# Patient Record
Sex: Male | Born: 1945 | Race: White | Hispanic: No | Marital: Married | Smoking: Current every day smoker
Health system: Southern US, Community
[De-identification: ages and names within clinical notes are randomized; demographics above are authoritative.]

## PROBLEM LIST (undated history)

## (undated) DIAGNOSIS — J45909 Unspecified asthma, uncomplicated: Secondary | ICD-10-CM

## (undated) DIAGNOSIS — Z720 Tobacco use: Secondary | ICD-10-CM

## (undated) DIAGNOSIS — E785 Hyperlipidemia, unspecified: Secondary | ICD-10-CM

## (undated) DIAGNOSIS — I2510 Atherosclerotic heart disease of native coronary artery without angina pectoris: Secondary | ICD-10-CM

## (undated) HISTORY — DX: Atherosclerotic heart disease of native coronary artery without angina pectoris: I25.10

## (undated) HISTORY — PX: OTHER SURGICAL HISTORY: SHX169

## (undated) HISTORY — DX: Tobacco use: Z72.0

## (undated) HISTORY — DX: Hyperlipidemia, unspecified: E78.5

---

## 1998-01-30 ENCOUNTER — Ambulatory Visit (HOSPITAL_COMMUNITY): Admission: RE | Admit: 1998-01-30 | Discharge: 1998-01-30 | Payer: Self-pay | Admitting: Surgery

## 2001-11-22 ENCOUNTER — Encounter: Payer: Self-pay | Admitting: Emergency Medicine

## 2001-11-22 ENCOUNTER — Emergency Department (HOSPITAL_COMMUNITY): Admission: EM | Admit: 2001-11-22 | Discharge: 2001-11-22 | Payer: Self-pay | Admitting: Emergency Medicine

## 2004-06-02 ENCOUNTER — Inpatient Hospital Stay (HOSPITAL_BASED_OUTPATIENT_CLINIC_OR_DEPARTMENT_OTHER): Admission: RE | Admit: 2004-06-02 | Discharge: 2004-06-02 | Payer: Self-pay | Admitting: Cardiology

## 2004-06-05 ENCOUNTER — Ambulatory Visit (HOSPITAL_COMMUNITY): Admission: RE | Admit: 2004-06-05 | Discharge: 2004-06-06 | Payer: Self-pay | Admitting: Cardiology

## 2007-07-07 ENCOUNTER — Ambulatory Visit (HOSPITAL_COMMUNITY): Admission: RE | Admit: 2007-07-07 | Discharge: 2007-07-07 | Payer: Self-pay | Admitting: Cardiology

## 2007-08-07 ENCOUNTER — Encounter: Payer: Self-pay | Admitting: Cardiology

## 2010-06-03 ENCOUNTER — Encounter: Admission: RE | Admit: 2010-06-03 | Discharge: 2010-06-03 | Payer: Self-pay | Admitting: Cardiology

## 2010-06-03 ENCOUNTER — Ambulatory Visit: Payer: Self-pay | Admitting: Cardiology

## 2010-09-02 ENCOUNTER — Encounter
Admission: RE | Admit: 2010-09-02 | Discharge: 2010-09-02 | Payer: Self-pay | Source: Home / Self Care | Admitting: Cardiology

## 2011-01-22 ENCOUNTER — Telehealth: Payer: Self-pay | Admitting: Cardiology

## 2011-01-22 NOTE — Telephone Encounter (Signed)
WANTS TO KNOW IF SAMPLES PLAVIX 75MG  AND NIASAN 500MG 

## 2011-01-22 NOTE — Telephone Encounter (Signed)
Samples of Plavix 75 mg and Niaspan 500 mg left up front for wife to pick up.  Wife notified.

## 2011-02-12 NOTE — H&P (Signed)
NAME:  Carl Barrera, Carl Barrera                            ACCOUNT NO.:  [id number]   MEDICAL RECORD NO.:  [id number]                   PATIENT TYPE:  AMB   LOCATION:                                       FACILITY:  [facility]   PHYSICIAN:  Colleen Can. Deborah Chalk, M.D.            DATE OF BIRTH:  Jan 08, 1946   DATE OF ADMISSION:  06/02/2004  DATE OF DISCHARGE:                                HISTORY & PHYSICAL   CHIEF COMPLAINT:  None.   HISTORY OF PRESENT ILLNESS:  Carl Barrera is a 65 year old white male who has  known coronary disease.  He presented for his routine annual appointment on  May 26, 2004.  At that time, he underwent regular treadmill testing.  He  was able to exercise on the standard Bruce protocol for a total of 9 minutes  to a maximum of 3.4 miles per hour at 14% grade.  The test was stopped due  to completion.  There were no clinical complaints of chest pain.  However,  his EKG did demonstrate evidence of ischemia in the inferolateral leads.  He  now presents for repeat catheterization.  Clinically, he has done well with  no complaints of chest pain.   PAST MEDICAL HISTORY:  1.  Atherosclerotic cardiovascular disease.  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  His last catheterization was in April of 1995 which showed      normal LV function.  The left circumflex was satisfactory.  There was      diffuse disease otherwise in the right coronary as well as the LAD that      was not suited for angioplasty due to the diffuseness nature.  At that      time, he was managed medically.  2.  Ongoing tobacco abuse.  3.  Aspirin allergy.  4.  Hyperlipidemia.   ALLERGIES:  ASPIRIN.   CURRENT MEDICINES:  1.  Niaspan 500 mg at bedtime.  2.  Nitroglycerin p.r.n.  3.  Plavix 75 mg a day.  4.  Lipitor 40 mg daily.  5.  Zantac 75 mg daily.  6.  Imdur 30 mg daily.   FAMILY HISTORY:  Family history is negative for heart  disease.   SOCIAL HISTORY:  He is married.  He lives in [place].  He does have ongoing  tobacco abuse and no alcohol use.   REVIEW OF SYSTEMS:  Review of systems is as noted above and is otherwise  unremarkable.   PHYSICAL EXAMINATION:  GENERAL:  On exam, he is currently in no acute  distress.  He is currently pain-free.  VITAL SIGNS:  Blood pressure initially 170/100.  Heart rate is in the low  50s.  Respiratory rate is 18.  He is afebrile.  SKIN:  Skin is warm  and dry.  Color is unremarkable.  LUNGS:  Lungs are somewhat coarse.  CARDIAC:  Exam shows a regular rhythm.  ABDOMEN:  Abdomen is soft.  EXTREMITIES:  Extremities are without edema.  NEUROLOGIC:  Neurologic is intact.   PERTINENT LABORATORIES:  Pertinent labs are pending.   OVERALL IMPRESSION:  1.  Abnormal EKG treadmill testing.  2.  Known coronary disease with remote history of inferoposterior myocardial      infarction that dates back to January of 1994 with angioplasty of the      left circumflex.  3.  Ongoing tobacco abuse.  4.  Hyperlipidemia.  5.  Elevated blood pressure.   PLAN:  We will proceed on with repeat catheterization.  The procedure has  been reviewed in full detail including the risks and benefits, and he is  willing to proceed on Tuesday, June 02, 2004.      Sharlee Blew, N.P.                     Colleen Can. Deborah Chalk, M.D.    LC/MEDQ  D:  05/26/2004  T:  05/26/2004  Job:  [id number]   cc:   Colon Flattery, D.O.  [address]  [place]  [place] [address]  Fax: [phone]

## 2011-02-12 NOTE — Cardiovascular Report (Signed)
NAME:  Carl Barrera, Carl Barrera                            ACCOUNT NO.:  [id number]   MEDICAL RECORD NO.:  [id number]                   PATIENT TYPE:  OIB   LOCATION:  6501                                 FACILITY:  [facility]   PHYSICIAN:  Colleen Can. Deborah Chalk, M.D.            DATE OF BIRTH:  02-28-1946   DATE OF PROCEDURE:  06/02/2004  DATE OF DISCHARGE:                              CARDIAC CATHETERIZATION   HISTORY:  Mr. Sebo has a history of inferior posterior myocardial  infarction in 1994 with angioplasty of his left circumflex coronary.  He has  done well since that time with annual exercise stress tests.  We have only  been moderately successful in modifying cardiovascular risk factors and he  continues to smoke.  He presents with worsening EKG changes in a serial  fashion on exercise stress study.   PROCEDURE:  Left heart catheterization with selective coronary angiography,  left ventricular angiography.   TYPE AND SITE OF ENTRY:  Percutaneous, right femoral artery.   CATHETERS:  4-French 4 curved Judkins right and left coronary catheters; 4-  French pigtail ventriculographic catheter.   CONTRAST MATERIAL:  Omnipaque.   MEDICATIONS GIVEN PRIOR TO THE PROCEDURE:  Valium 10 mg p.o.   MEDICATIONS GIVEN DURING THE PROCEDURE:  Versed 2 mg IV.   COMMENTS:  The patient tolerated the procedure well.   HEMODYNAMIC DATA:  1.  The aortic pressure was 122/69.  2.  LV pressure was 124/6-8.   ANGIOGRAPHIC DATA:  1.  Right coronary artery had a diffuse disease proximally.  In the      beginning of the posterior descending there was a 90% narrowing over      approximately 10 mm.  There was satisfactory luminal size prior and post      to this severe stenosis.  There was diffuse disease in the right      coronary artery otherwise.  2.  Left main coronary artery is normal.  3.  Left circumflex:  Continues as a large obtuse marginal branch.  There      are diffuse irregularities, but no significant  focal narrowing.  This      large obtuse marginal continues to the apex.  4.  Left anterior descending:  Moderately large vessel that crosses the      apex.  The left anterior descending is relatively free of disease      proximally including a large first diagonal vessel.  It continues      relatively free of severe disease until the origin of the second      diagonal vessel.  The origin of the second diagonal has severe 80%+      narrowing.  We then enter a segment of the left anterior descending that      would be approximately 30 mm in length.  This area is severely and      diffusely diseased.  Would  be approximately 80% narrowed.  There is      diffuse irregularities even further distally but the vessel is      relatively small and obviously would be diffusely diseased.  There is      potential for long segmental stent being placed in the left anterior      descending that has a potential of occluding the second diagonal versus      continued medical management of this area.   LEFT VENTRICULAR ANGIOGRAM:  Left ventricular angiogram was performed in the  RAO position.  Overall cardiac size and silhouette are normal.  The global  ejection fraction is estimated to be at 50-60%.  There is very minimal  distal inferior lateral hypokinesia.  Inferior wall and anterior wall track  well.  There is no mitral regurgitation, intracardiac calcification, or  intracavitary filling defect.   OVERALL IMPRESSION:  1.  Essentially normal left ventricular function with very minimal distal      inferior lateral hypokinesia.  2.  Diffuse three vessel coronary disease with severe stenosis in posterior      descending coronary that would be amenable to percutaneous intervention,      diffuse irregularities without severe focal narrowing in the left      circumflex, severe segmental disease in left anterior descending that is      relatively distal, but does not appear to be ideally approachable with       percutaneous intervention.   DISCUSSION:  It is felt that probably the best option will be to try to open  the severe stenosis in the posterior descending.  At that point in time Mr.  Pringle would have relatively distal single vessel disease with well preserved  left ventricular function and should have a good prognosis.  If he continues  to have an abnormal stress test, we could consider a higher risk  intervention on his diffuse segmental area in the left anterior descending,  but would try to reserve that should symptoms dictate.                                               Colleen Can. Deborah Chalk, M.D.    SNT/MEDQ  D:  06/02/2004  T:  06/02/2004  Job:  [id number]

## 2011-02-12 NOTE — Cardiovascular Report (Signed)
NAME:  ADEEL, GUIFFRE                            ACCOUNT NO.:  [id number]   MEDICAL RECORD NO.:  [id number]                   PATIENT TYPE:  OIB   LOCATION:  6527                                 FACILITY:  [facility]   PHYSICIAN:  Colleen Can. Deborah Chalk, M.D.            DATE OF BIRTH:  May 23, 1946   DATE OF PROCEDURE:  06/04/2004  DATE OF DISCHARGE:                              CARDIAC CATHETERIZATION   PROCEDURE:  Stent placement to the posterior descending coronary artery.   The initial angiograms demonstrated a 90%, 10 mm stenosis in the posterior  descending artery as well as moderate, diffuse irregularities in the  proximal right coronary artery.  Using a JR-4 guide, a Hydro Floppy guide  wire was easily passed to cross the stenosis.  We predilated it with a 2.5 x  10 mm Maverick balloon and then returned with a 2.5 x 13 mm Cypher stent to  a total of 14 atmospheres.  Initially, there appeared to be spasm distal to  the stent and intracoronary.  Nitroglycerin was given with complete  resolution.  The final angiographic result was felt to be excellent.   We then turned our attention to the left coronary artery and took one  picture of that using the 6-French system.  This confirmed the presence of  diffuse 70% narrowings in the left anterior descending and really no clear-  cut point to begin or end this stent.  We will continued to try to treat him  medically in this regard.   OVERALL IMPRESSION:  1.  Successful stent placement in posterior descending coronary.  2.  Segmental 70% narrowing in the left anterior descending, involving the      second diagonal vessel with a decision to proceed on medically in      management of this area.                                               Colleen Can. Deborah Chalk, M.D.    SNT/MEDQ  D:  06/05/2004  T:  06/06/2004  Job:  [id number]   cc:   Colon Flattery, D.O.  [address]  [place]  [place] [address]  Fax: [phone]

## 2011-02-12 NOTE — Discharge Summary (Signed)
NAME:  Carl Barrera, Carl Barrera                            ACCOUNT NO.:  [id number]   MEDICAL RECORD NO.:  [id number]                   PATIENT TYPE:  OIB   LOCATION:  6527                                 FACILITY:  [facility]   PHYSICIAN:  Colleen Can. Deborah Chalk, M.D.            DATE OF BIRTH:  18-Oct-1945   DATE OF ADMISSION:  06/05/2004  DATE OF DISCHARGE:  06/06/2004                                 DISCHARGE SUMMARY   PRIMARY DISCHARGE DIAGNOSES:  1.  Atherosclerotic cardiovascular disease with recent elective cardiac      catheterization on June 02, 2004 showing essentially normal left      ventricular function, very minimal distal inferolateral hypokinesia,      diffuse 3 vessel coronary disease with severe stenosis in the posterior      descending, diffuse irregularities without severe focal narrowing in the      left circumflex, severe segmental disease in the left anterior      descending that is relatively distal, but does not appear to be ideally      approachable with percutaneous intervention.  He is now status post      stent placement with a 2.5x13 mm CYPHER stent to the posterior      descending artery on June 04, 2004.  2.  Known atherosclerotic cardiovascular disease, previous history of      inferoposterior myocardial infarction that dates back to January of 1994      with previous angioplasty of the left circumflex.  3.  Ongoing tobacco abuse.  4.  Aspirin allergy.  5.  Hyperlipidemia.   HISTORY OF PRESENT ILLNESS:  The patient is a 65 year old white male who has  known coronary disease.  He presented for his routine treadmill test here on  May 26, 2004.  At that time, he was able to exercise on the standard  Bruce protocol for a total of 9 minutes to a maximum of 3.4 miles per hour  at 14% grade.  The test was stopped due to completion and there were  clinically no complaints of chest pain; however, his EKG did demonstrate  evidence of ischemia in the inferolateral leads.   He subsequently was  referred for catheterization which was carried out on September 6.  Those  results are as noted above.  He now presents for elective percutaneous  coronary intervention.   See the dictated history and physical for further patient presentation and  profile.   ADMISSION LABORATORY STUDIES:  Chemistries are normal except for a glucose  of 105.  CBC is normal.  PT and PTT are unremarkable.   HOSPITAL COURSE:  The patient was brought back to the hospital electively on  June 05, 2004 for percutaneous coronary intervention.  That procedure  was performed by Dr. Roger Shelter without any known complications.  A 2.5  x 13 mm CYPHER stent was placed to the posterior defending.  The overall  result was satisfactory.  He will now be transferred to 6500 and hopefully  will be discharged to home in the morning if stable on a.m. rounds.   DISCHARGE CONDITION:  Stable.   DISCHARGE MEDICATIONS:  He will resume all of his previous medicines as he  was taking before.  He is on Plavix chronically due to aspirin allergy.   His activity is to be light over the next few days.   He is to place an ice pack to the groin, if needed.   He is strongly encouraged to stop smoking.   We will see him back in the office in approximately 10 days and he is asked  to call the office on Monday to schedule that appointment.      Sharlee Blew, N.P.                     Colleen Can. Deborah Chalk, M.D.    LC/MEDQ  D:  06/05/2004  T:  06/06/2004  Job:  [id number]   cc:   Colon Flattery, D.O.  [address]  [place]  [place] [address]  Fax: [phone]

## 2011-02-18 ENCOUNTER — Telehealth: Payer: Self-pay | Admitting: Cardiology

## 2011-02-18 NOTE — Telephone Encounter (Signed)
Attempted to return pts call on wifes cell as requested, and the line was busy.  Will continue to contact pt.

## 2011-02-18 NOTE — Telephone Encounter (Signed)
Pt returning call to [facility] about new dr call his home or wife's cell [phone]

## 2011-04-06 ENCOUNTER — Encounter: Payer: Self-pay | Admitting: Cardiology

## 2011-04-06 ENCOUNTER — Encounter: Payer: Self-pay | Admitting: *Deleted

## 2011-04-07 ENCOUNTER — Encounter: Payer: Self-pay | Admitting: Cardiology

## 2011-04-07 ENCOUNTER — Ambulatory Visit (INDEPENDENT_AMBULATORY_CARE_PROVIDER_SITE_OTHER): Payer: BC Managed Care – PPO | Admitting: Cardiology

## 2011-04-07 DIAGNOSIS — F17200 Nicotine dependence, unspecified, uncomplicated: Secondary | ICD-10-CM

## 2011-04-07 DIAGNOSIS — E785 Hyperlipidemia, unspecified: Secondary | ICD-10-CM

## 2011-04-07 DIAGNOSIS — Z720 Tobacco use: Secondary | ICD-10-CM

## 2011-04-07 DIAGNOSIS — I2510 Atherosclerotic heart disease of native coronary artery without angina pectoris: Secondary | ICD-10-CM | POA: Insufficient documentation

## 2011-04-07 NOTE — Assessment & Plan Note (Signed)
The patient has cardiovascular disease (greater than 20 minutes reviewing the chart, reviewing old records and discussing with the patient). I did find her last stress perfusion study in 2008. Given the degree of disease and he had is very reasonable to screen him with an exercise perfusion study particularly the following LAD lesion. Will otherwise continue with risk reduction.

## 2011-04-07 NOTE — Assessment & Plan Note (Signed)
We discussed this at length. He had failed all attempts to quit since he did not try any further. He is not on any prescriptions.

## 2011-04-07 NOTE — Patient Instructions (Signed)
You are being scheduled for a Lexiscan Myoview.  Please follow the instruction sheet given. Please have fasting Lipid panel at [facility] the same day as your stress test. Please continue your current medications as listed.

## 2011-04-07 NOTE — Progress Notes (Signed)
HPI The patient presents as a new patient to me having been seen by Dr. Deborah Chalk.  He has a history of coronary disease as I have answered below. Since he was last seen by Dr. Deborah Chalk he has had no cardiovascular problems.  He denies chest pressure, neck or arm discomfort. He denies any palpitations, presyncope or syncope. He said no new shortness of breath, PND or orthopnea. No weight gain. He says he doesn't exercise but he does remain active doing lots of things around his house.  Allergies  Allergen Reactions  . Aspirin     Rash    Current Outpatient Prescriptions  Medication Sig Dispense Refill  . clopidogrel (PLAVIX) 75 MG tablet Take 75 mg by mouth daily.        . isosorbide mononitrate (IMDUR) 30 MG 24 hr tablet Take 30 mg by mouth daily.        . niacin (NIASPAN) 500 MG CR tablet Take 1,000 mg by mouth at bedtime.        . nitroGLYCERIN (NITROSTAT) 0.4 MG SL tablet Place 0.4 mg under the tongue every 5 (five) minutes as needed.        . ranitidine (ZANTAC) 75 MG tablet Take 75 mg by mouth as needed.       . simvastatin (ZOCOR) 80 MG tablet Take 80 mg by mouth at bedtime.          Past Medical History  Diagnosis Date  . Cardiovascular disease     MI PTCA of a circumflex lesion in 1994. Last catheterization 2005 with stenting of the PDA the road stent. At that time he had 70% LAD stenosis managed medically.  . Hyperlipidemia   . HTN (hypertension)     Patient denies this  . Tobacco abuse     Past Surgical History  Procedure Date  . None     ROS:  As stated in the HPI and negative for all other systems.  PHYSICAL EXAM BP 112/68  Pulse 64  Resp 16  Ht 5\' 11"  (1.803 m)  Wt 152 lb (68.947 kg)  BMI 21.20 kg/m2 GENERAL:  Well appearing HEENT:  Pupils equal round and reactive, fundi not visualized, oral mucosa unremarkable, dentures NECK:  No jugular venous distention, waveform within normal limits, carotid upstroke brisk and symmetric, no bruits, no  thyromegaly LYMPHATICS:  No cervical, inguinal adenopathy LUNGS:  Clear to auscultation bilaterally BACK:  No CVA tenderness CHEST:  Unremarkable HEART:  PMI not displaced or sustained,S1 and S2 within normal limits, no S3, no S4, no clicks, no rubs, no murmurs ABD:  Flat, positive bowel sounds normal in frequency in pitch, no bruits, no rebound, no guarding, no midline pulsatile mass, no hepatomegaly, no splenomegaly EXT:  2 plus pulses throughout, no edema, no cyanosis no clubbing SKIN:  No rashes no nodules NEURO:  Cranial nerves II through XII grossly intact, motor grossly intact throughout PSYCH:  Cognitively intact, oriented to person place and time   EKG:  Sinus rhythm, rate 69, axis within normal limits, intervals within normal limits, no acute ST-T wave changes.   ASSESSMENT AND PLAN

## 2011-04-07 NOTE — Assessment & Plan Note (Signed)
I will check a lipid profile. The goal is LDL less than 70 HDL greater than 40.

## 2011-04-09 ENCOUNTER — Telehealth: Payer: Self-pay | Admitting: *Deleted

## 2011-04-09 NOTE — Telephone Encounter (Signed)
Auth # [id number] exp 05/08/11 04/14/11 Stress myoview @ [facility]  scheduled 04/07/11 by Dr. Antoine Poche in [place].

## 2011-04-13 ENCOUNTER — Encounter: Payer: Self-pay | Admitting: *Deleted

## 2011-04-14 DIAGNOSIS — R072 Precordial pain: Secondary | ICD-10-CM

## 2011-04-15 ENCOUNTER — Encounter: Payer: Self-pay | Admitting: Cardiology

## 2011-06-24 ENCOUNTER — Other Ambulatory Visit: Payer: Self-pay | Admitting: Cardiology

## 2011-06-25 ENCOUNTER — Other Ambulatory Visit: Payer: Self-pay | Admitting: *Deleted

## 2011-06-25 MED ORDER — CLOPIDOGREL BISULFATE 75 MG PO TABS: 75.0000 mg | ORAL_TABLET | Freq: Every day | ORAL | Status: DC

## 2011-07-01 ENCOUNTER — Telehealth: Payer: Self-pay | Admitting: Cardiology

## 2011-07-01 NOTE — Telephone Encounter (Signed)
Simvastatin 80 mg and niaspan 500mg   Refill needed pt out uses [employer]

## 2011-07-05 MED ORDER — NIACIN ER (ANTIHYPERLIPIDEMIC) 500 MG PO TBCR: 1000.0000 mg | EXTENDED_RELEASE_TABLET | Freq: Every day | ORAL | Status: DC

## 2011-07-05 MED ORDER — SIMVASTATIN 80 MG PO TABS: 80.0000 mg | ORAL_TABLET | Freq: Every day | ORAL | Status: DC

## 2011-10-27 ENCOUNTER — Other Ambulatory Visit: Payer: Self-pay | Admitting: Cardiology

## 2011-12-20 ENCOUNTER — Other Ambulatory Visit: Payer: Self-pay | Admitting: Cardiology

## 2011-12-22 ENCOUNTER — Other Ambulatory Visit: Payer: Self-pay | Admitting: Cardiology

## 2012-04-12 ENCOUNTER — Other Ambulatory Visit: Payer: Self-pay

## 2012-04-12 ENCOUNTER — Encounter: Payer: Self-pay | Admitting: Cardiology

## 2012-04-12 ENCOUNTER — Ambulatory Visit (INDEPENDENT_AMBULATORY_CARE_PROVIDER_SITE_OTHER): Payer: Medicare Other | Admitting: Cardiology

## 2012-04-12 VITALS — BP 115/75 | HR 84 | Ht 71.0 in | Wt 144.0 lb

## 2012-04-12 DIAGNOSIS — E785 Hyperlipidemia, unspecified: Secondary | ICD-10-CM

## 2012-04-12 DIAGNOSIS — F17200 Nicotine dependence, unspecified, uncomplicated: Secondary | ICD-10-CM

## 2012-04-12 DIAGNOSIS — I2510 Atherosclerotic heart disease of native coronary artery without angina pectoris: Secondary | ICD-10-CM

## 2012-04-12 DIAGNOSIS — Z720 Tobacco use: Secondary | ICD-10-CM

## 2012-04-12 MED ORDER — CLOPIDOGREL BISULFATE 75 MG PO TABS: 75.0000 mg | ORAL_TABLET | Freq: Every day | ORAL | Status: DC

## 2012-04-12 MED ORDER — ISOSORBIDE MONONITRATE ER 30 MG PO TB24: 30.0000 mg | ORAL_TABLET | Freq: Every day | ORAL | Status: DC

## 2012-04-12 MED ORDER — SIMVASTATIN 80 MG PO TABS: 80.0000 mg | ORAL_TABLET | Freq: Every day | ORAL | Status: DC

## 2012-04-12 NOTE — Progress Notes (Signed)
HPI The patient presents for followup of his known coronary disease. Since I last saw him he has done well.  The patient denies any new symptoms such as chest discomfort, neck or arm discomfort. There has been no new shortness of breath, PND or orthopnea. There have been no reported palpitations, presyncope or syncope. He doesn't exercise routinely but he is very active around his yard. With aggressive activity such as pushing a mower gets no symptoms.  Allergies  Allergen Reactions  . Aspirin     Rash    Current Outpatient Prescriptions  Medication Sig Dispense Refill  . clopidogrel (PLAVIX) 75 MG tablet Take 1 tablet (75 mg total) by mouth daily.  30 tablet  12  . isosorbide mononitrate (IMDUR) 30 MG 24 hr tablet TAKE 1 TABLET EVERY DAY  90 tablet  3  . niacin (NIASPAN) 500 MG CR tablet Take 2 tablets (1,000 mg total) by mouth at bedtime.  60 tablet  12  . nitroGLYCERIN (NITROSTAT) 0.4 MG SL tablet Place 0.4 mg under the tongue every 5 (five) minutes as needed.        . ranitidine (ZANTAC) 75 MG tablet Take 75 mg by mouth as needed.       . simvastatin (ZOCOR) 80 MG tablet Take 1 tablet (80 mg total) by mouth at bedtime.  30 tablet  12    Past Medical History  Diagnosis Date  . Cardiovascular disease     MI PTCA of a circumflex lesion in 1994. Last catheterization 2005 with stenting of the PDA stent. At that time he had 70% LAD stenosis managed medically.  . Hyperlipidemia   . HTN (hypertension)     Patient denies this  . Tobacco abuse     Past Surgical History  Procedure Date  . None     ROS:  As stated in the HPI and negative for all other systems.  PHYSICAL EXAM BP 115/75  Pulse 84  Ht 5\' 11"  (1.803 m)  Wt 144 lb (65.318 kg)  BMI 20.08 kg/m2 GENERAL:  Well appearing HEENT:  Pupils equal round and reactive, fundi not visualized, oral mucosa unremarkable, dentures NECK:  No jugular venous distention, waveform within normal limits, carotid upstroke brisk and  symmetric, no bruits, no thyromegaly LYMPHATICS:  No cervical, inguinal adenopathy LUNGS:  Clear to auscultation bilaterally BACK:  No CVA tenderness CHEST:  Unremarkable HEART:  PMI not displaced or sustained,S1 and S2 within normal limits, no S3, no S4, no clicks, no rubs, no murmurs ABD:  Flat, positive bowel sounds normal in frequency in pitch, no bruits, no rebound, no guarding, no midline pulsatile mass, no hepatomegaly, no splenomegaly EXT:  2 plus pulses throughout, no edema, no cyanosis no clubbing   EKG:  Sinus rhythm, rate 84, axis within normal limits, intervals within normal limits, no acute ST-T wave changes, low voltage limb leads.  04/12/2012   ASSESSMENT AND PLAN   CAD -  The patient had a negative stress perfusion study last year with an EF of 61%. He has no new symptoms. No further cardiovascular testing is suggested. We will continue with risk reduction.   Tobacco abuse -  We discussed this at length. He had failed all attempts to quit since he did not try any further. He does not want any prescriptions.  Hyperlipidemia - I will check a lipid profile. The goal is LDL less than 70 HDL greater than 40.  Last year the LDL is 51 with an HDL of 48.

## 2012-04-12 NOTE — Telephone Encounter (Signed)
..   Requested Prescriptions   Signed Prescriptions Disp Refills  . clopidogrel (PLAVIX) 75 MG tablet 90 tablet 3    Sig: Take 1 tablet (75 mg total) by mouth daily.    Authorizing Provider: Rollene Rotunda    Ordering User: Muriel Wilber M  . isosorbide mononitrate (IMDUR) 30 MG 24 hr tablet 90 tablet 3    Sig: Take 1 tablet (30 mg total) by mouth daily.    Authorizing Provider: Rollene Rotunda    Ordering User: Daphnee Preiss M  . simvastatin (ZOCOR) 80 MG tablet 90 tablet 3    Sig: Take 1 tablet (80 mg total) by mouth at bedtime.    Authorizing Provider: Rollene Rotunda    Ordering User: Christella Hartigan, Jalyric Kaestner Judie Petit

## 2012-04-12 NOTE — Patient Instructions (Addendum)
Continue current medications as listed.  Please have fasting blood work at Dr Avery Dennison office (LIPID)  Follow up in 1 year with Dr Antoine Poche in [place].  You will receive a letter in the mail 2 months before you are due.  Please call us when you receive this letter to schedule your follow up appointment.

## 2012-04-17 ENCOUNTER — Other Ambulatory Visit: Payer: Self-pay | Admitting: Cardiology

## 2012-04-17 LAB — LIPID PANEL
HDL: 27 mg/dL — ABNORMAL LOW (ref >39)
LDL Cholesterol: 49 mg/dL (ref 0–99)
Total CHOL/HDL Ratio: 3.4 Ratio
VLDL: 16 mg/dL (ref 0–40)

## 2012-04-18 ENCOUNTER — Encounter: Payer: Self-pay | Admitting: Cardiology

## 2012-04-26 ENCOUNTER — Telehealth: Payer: Self-pay | Admitting: Cardiology

## 2012-04-26 NOTE — Telephone Encounter (Signed)
Patient returning nurse call, she can be reached at [phone]

## 2012-04-26 NOTE — Telephone Encounter (Signed)
Reviewed labs results with pt. He will decrease simvastatin as ordered  He will call back when he needs a refill.

## 2012-06-30 ENCOUNTER — Other Ambulatory Visit: Payer: Self-pay | Admitting: Cardiology

## 2012-08-22 ENCOUNTER — Other Ambulatory Visit: Payer: Self-pay | Admitting: Cardiology

## 2012-10-24 ENCOUNTER — Other Ambulatory Visit: Payer: Self-pay | Admitting: Cardiology

## 2013-04-18 ENCOUNTER — Ambulatory Visit (INDEPENDENT_AMBULATORY_CARE_PROVIDER_SITE_OTHER): Payer: Medicare HMO | Admitting: Cardiology

## 2013-04-18 ENCOUNTER — Encounter: Payer: Self-pay | Admitting: Cardiology

## 2013-04-18 VITALS — BP 115/71 | HR 69 | Ht 72.0 in | Wt 144.0 lb

## 2013-04-18 DIAGNOSIS — I2510 Atherosclerotic heart disease of native coronary artery without angina pectoris: Secondary | ICD-10-CM

## 2013-04-18 NOTE — Patient Instructions (Addendum)
Please stop your Niacin. Continue all other medications as listed  Please have a fasting lipid and liver panel in 3 months at your primary care doctor's office.  Follow up in 1 year with Dr Antoine Poche.  You will receive a letter in the mail 2 months before you are due.  Please call us when you receive this letter to schedule your follow up appointment.

## 2013-04-18 NOTE — Progress Notes (Signed)
HPI The patient presents for followup of his known coronary disease. Since I last saw him he has done well.  The patient denies any new symptoms such as chest discomfort, neck or arm discomfort. There has been no new shortness of breath, PND or orthopnea. There have been no reported palpitations, presyncope or syncope. He is active in his daily living and denies any cardiovascular symptoms.  Unfortunately he is unable to stop smoking.  Allergies  Allergen Reactions  . Aspirin     Rash    Current Outpatient Prescriptions  Medication Sig Dispense Refill  . clopidogrel (PLAVIX) 75 MG tablet Take 1 tablet (75 mg total) by mouth daily.  90 tablet  3  . isosorbide mononitrate (IMDUR) 30 MG 24 hr tablet Take 1 tablet (30 mg total) by mouth daily.  90 tablet  3  . niacin (NIASPAN) 500 MG CR tablet TAKE 2 TABLETS (1,000 MG TOTAL) BY MOUTH AT BEDTIME.  60 tablet  4  . NITROSTAT 0.4 MG SL tablet USE AS DIRECTED EVERY 5 MINS. UP TO 3 X AS NEEDED  25 tablet  1  . ranitidine (ZANTAC) 75 MG tablet Take 75 mg by mouth as needed.       . simvastatin (ZOCOR) 80 MG tablet Take 1 tablet (80 mg total) by mouth at bedtime.  90 tablet  3   No current facility-administered medications for this visit.    Past Medical History  Diagnosis Date  . Cardiovascular disease     MI PTCA of a circumflex lesion in 1994. Last catheterization 2005 with stenting of the PDA stent. At that time he had 70% LAD stenosis managed medically.  . Hyperlipidemia   . Tobacco abuse     Past Surgical History  Procedure Laterality Date  . None      ROS:  As stated in the HPI and negative for all other systems.  PHYSICAL EXAM BP 115/71  Pulse 69  Ht 6' (1.829 m)  Wt 144 lb (65.318 kg)  BMI 19.53 kg/m2 GENERAL:  Well appearing HEENT:  Pupils equal round and reactive, fundi not visualized, oral mucosa unremarkable, dentures NECK:  No jugular venous distention, waveform within normal limits, carotid upstroke brisk and  symmetric, no bruits, no thyromegaly LYMPHATICS:  No cervical, inguinal adenopathy LUNGS:  Clear to auscultation bilaterally BACK:  No CVA tenderness CHEST:  Unremarkable HEART:  PMI not displaced or sustained,S1 and S2 within normal limits, no S3, no S4, no clicks, no rubs, no murmurs ABD:  Flat, positive bowel sounds normal in frequency in pitch, no bruits, no rebound, no guarding, no midline pulsatile mass, no hepatomegaly, no splenomegaly EXT:  2 plus pulses throughout, no edema, no cyanosis no clubbing   EKG:  Sinus rhythm, rate 69, axis within normal limits, intervals within normal limits, no acute ST-T wave changes, low voltage limb leads.  04/18/2013   ASSESSMENT AND PLAN   CAD -  The patient had a negative stress perfusion study in 2012 with an EF of 61%. He has no new symptoms. No further cardiovascular testing is suggested. We will continue with risk reduction.   Tobacco abuse -  We discussed this at length. He had failed all attempts to quit including Chantix.  He wants no other extensive prescriptions therapy.  Hyperlipidemia - I reviewed his last HDL was 27. This was last year. I don't see a benefit to continued niacin. He will stop this. He will continue his simvastatin. He will get a lipid profile  and liver enzymes in 3 months.

## 2013-05-07 ENCOUNTER — Other Ambulatory Visit: Payer: Self-pay | Admitting: Cardiology

## 2013-05-31 ENCOUNTER — Other Ambulatory Visit: Payer: Self-pay | Admitting: Cardiology

## 2013-07-19 ENCOUNTER — Other Ambulatory Visit: Payer: Self-pay | Admitting: Cardiology

## 2013-07-19 LAB — HEPATIC FUNCTION PANEL
AST: 16 U/L (ref 0–37)
Bilirubin, Direct: 0.2 mg/dL (ref 0.0–0.3)
Indirect Bilirubin: 0.4 mg/dL (ref 0.0–0.9)
Total Bilirubin: 0.6 mg/dL (ref 0.3–1.2)

## 2013-07-19 LAB — LIPID PANEL
Cholesterol: 106 mg/dL (ref 0–200)
Total CHOL/HDL Ratio: 3.7 Ratio

## 2013-11-26 ENCOUNTER — Other Ambulatory Visit: Payer: Self-pay | Admitting: Cardiology

## 2014-04-24 ENCOUNTER — Encounter: Payer: Self-pay | Admitting: Cardiology

## 2014-04-24 ENCOUNTER — Ambulatory Visit (INDEPENDENT_AMBULATORY_CARE_PROVIDER_SITE_OTHER): Payer: Medicare HMO | Admitting: Cardiology

## 2014-04-24 VITALS — BP 135/79 | HR 76 | Ht 71.0 in | Wt 132.6 lb

## 2014-04-24 DIAGNOSIS — E785 Hyperlipidemia, unspecified: Secondary | ICD-10-CM

## 2014-04-24 DIAGNOSIS — I2510 Atherosclerotic heart disease of native coronary artery without angina pectoris: Secondary | ICD-10-CM

## 2014-04-24 MED ORDER — NITROGLYCERIN 0.4 MG SL SUBL: SUBLINGUAL_TABLET | SUBLINGUAL | Status: DC

## 2014-04-24 MED ORDER — ISOSORBIDE MONONITRATE ER 30 MG PO TB24: ORAL_TABLET | ORAL | Status: DC

## 2014-04-24 MED ORDER — CLOPIDOGREL BISULFATE 75 MG PO TABS: ORAL_TABLET | ORAL | Status: DC

## 2014-04-24 NOTE — Patient Instructions (Signed)
The current medical regimen is effective;  continue present plan and medications.  Follow up in 1 year with Dr. Hochrein in the [place] office.  You will receive a letter in the mail 2 months before you are due.  Please call us when you receive this letter to schedule your follow up appointment.  

## 2014-04-24 NOTE — Progress Notes (Signed)
HPI The patient presents for followup of his known coronary disease. Since I last saw him he has done well.  The patient denies any new symptoms such as chest discomfort, neck or arm discomfort. There has been no new shortness of breath, PND or orthopnea. There have been no reported palpitations, presyncope or syncope. He is active in his daily living and takes care of his 69 year old grandson every day  With this he denies any cardiovascular symptoms.  Unfortunately he is unable to stop smoking.  Allergies  Allergen Reactions  . Aspirin     Rash    Current Outpatient Prescriptions  Medication Sig Dispense Refill  . clopidogrel (PLAVIX) 75 MG tablet TAKE 1 TABLET (75 MG TOTAL) BY MOUTH DAILY.  90 tablet  3  . isosorbide mononitrate (IMDUR) 30 MG 24 hr tablet TAKE 1 TABLET (30 MG TOTAL) BY MOUTH DAILY.  90 tablet  3  . NITROSTAT 0.4 MG SL tablet USE AS DIRECTED EVERY 5 MINS. UP TO 3 X AS NEEDED  25 tablet  1  . simvastatin (ZOCOR) 80 MG tablet TAKE 1 TABLET (80 MG TOTAL) BY MOUTH AT BEDTIME.  90 tablet  1  . ranitidine (ZANTAC) 75 MG tablet Take 75 mg by mouth as needed.        No current facility-administered medications for this visit.    Past Medical History  Diagnosis Date  . Cardiovascular disease     MI PTCA of a circumflex lesion in 1994. Last catheterization 2005 with stenting of the PDA stent. At that time he had 70% LAD stenosis managed medically.  . Hyperlipidemia   . Tobacco abuse     Past Surgical History  Procedure Laterality Date  . None      ROS:  Weight loss.  Otherwise as stated in the HPI and negative for all other systems.  PHYSICAL EXAM BP 135/79  Pulse 76  Ht 5\' 11"  (1.803 m)  Wt 132 lb 9.6 oz (60.147 kg)  BMI 18.50 kg/m2 GENERAL:  Well appearing, thin HEENT:  Pupils equal round and reactive, fundi not visualized, oral mucosa unremarkable, dentures NECK:  No jugular venous distention, waveform within normal limits, carotid upstroke brisk and  symmetric, no bruits, no thyromegaly LUNGS:  Clear to auscultation bilaterally BACK:  No CVA tenderness CHEST:  Unremarkable HEART:  PMI not displaced or sustained,S1 and S2 within normal limits, no S3, no S4, no clicks, no rubs, no murmurs ABD:  Flat, positive bowel sounds normal in frequency in pitch, no bruits, no rebound, no guarding, no midline pulsatile mass, no hepatomegaly, no splenomegaly EXT:  2 plus pulses throughout, no edema, no cyanosis no clubbing   EKG:  Sinus rhythm, rate 79, axis within normal limits, intervals within normal limits, no acute ST-T wave changes, low voltage limb leads.  04/24/2014   ASSESSMENT AND PLAN   CAD -  The patient had a negative stress perfusion study in 2012 with an EF of 61%. He has no new symptoms. No further cardiovascular testing is suggested. We will continue with risk reduction.   Tobacco abuse -  We talked about this again.  He has been through different stop smoking techniques which have not worked.  He does not want to try them again.   Hyperlipidemia - I reviewed his last HDL was 29 in the fall with an LDL of 59.  He will continue the current therapy.   Weight loss - He has lost 20 lbs in 3 years.  He will discuss this with Dr. Lysbeth GalasNyland.  He reports that he does not have a good appetite and eats when he his hungry.

## 2014-09-06 ENCOUNTER — Other Ambulatory Visit: Payer: Self-pay

## 2014-09-06 MED ORDER — SIMVASTATIN 80 MG PO TABS: ORAL_TABLET | ORAL | Status: DC

## 2015-04-28 ENCOUNTER — Encounter (INDEPENDENT_AMBULATORY_CARE_PROVIDER_SITE_OTHER): Payer: Self-pay

## 2015-04-28 ENCOUNTER — Ambulatory Visit (INDEPENDENT_AMBULATORY_CARE_PROVIDER_SITE_OTHER): Payer: Medicare PPO | Admitting: Family

## 2015-04-28 ENCOUNTER — Encounter: Payer: Self-pay | Admitting: Family

## 2015-04-28 ENCOUNTER — Telehealth: Payer: Self-pay | Admitting: Family Medicine

## 2015-04-28 VITALS — BP 123/72 | HR 81 | Temp 97.3°F | Ht 71.0 in | Wt 125.6 lb

## 2015-04-28 DIAGNOSIS — R42 Dizziness and giddiness: Secondary | ICD-10-CM | POA: Diagnosis not present

## 2015-04-28 DIAGNOSIS — H8110 Benign paroxysmal vertigo, unspecified ear: Secondary | ICD-10-CM

## 2015-04-28 NOTE — Telephone Encounter (Signed)
Patient states he gets dizzy when he bends over and then stands back up. Appointment given for today @ 3:10 with [facility].

## 2015-04-28 NOTE — Patient Instructions (Addendum)
Dizziness °Dizziness is a common problem. It is a feeling of unsteadiness or light-headedness. You may feel like you are about to faint. Dizziness can lead to injury if you stumble or fall. A person of any age group can suffer from dizziness, but dizziness is more common in older adults. °CAUSES  °Dizziness can be caused by many different things, including: °· Middle ear problems. °· Standing for too long. °· Infections. °· An allergic reaction. °· Aging. °· An emotional response to something, such as the sight of blood. °· Side effects of medicines. °· Tiredness. °· Problems with circulation or blood pressure. °· Excessive use of alcohol or medicines, or illegal drug use. °· Breathing too fast (hyperventilation). °· An irregular heart rhythm (arrhythmia). °· A low red blood cell count (anemia). °· Pregnancy. °· Vomiting, diarrhea, fever, or other illnesses that cause body fluid loss (dehydration). °· Diseases or conditions such as Parkinson's disease, high blood pressure (hypertension), diabetes, and thyroid problems. °· Exposure to extreme heat. °DIAGNOSIS  °Your health care provider will ask about your symptoms, perform a physical exam, and perform an electrocardiogram (ECG) to record the electrical activity of your heart. Your health care provider may also perform other heart or blood tests to determine the cause of your dizziness. These may include: °· Transthoracic echocardiogram (TTE). During echocardiography, sound waves are used to evaluate how blood flows through your heart. °· Transesophageal echocardiogram (TEE). °· Cardiac monitoring. This allows your health care provider to monitor your heart rate and rhythm in real time. °· Holter monitor. This is a portable device that records your heartbeat and can help diagnose heart arrhythmias. It allows your health care provider to track your heart activity for several days if needed. °· Stress tests by exercise or by giving medicine that makes the heart beat  faster. °TREATMENT  °Treatment of dizziness depends on the cause of your symptoms and can vary greatly. °HOME CARE INSTRUCTIONS  °· Drink enough fluids to keep your urine clear or pale yellow. This is especially important in very hot weather. In older adults, it is also important in cold weather. °· Take your medicine exactly as directed if your dizziness is caused by medicines. When taking blood pressure medicines, it is especially important to get up slowly. °¨ Rise slowly from chairs and steady yourself until you feel okay. °¨ In the morning, first sit up on the side of the bed. When you feel okay, stand slowly while holding onto something until you know your balance is fine. °· Move your legs often if you need to stand in one place for a long time. Tighten and relax your muscles in your legs while standing. °· Have someone stay with you for 1-2 days if dizziness continues to be a problem. Do this until you feel you are well enough to stay alone. Have the person call your health care provider if he or she notices changes in you that are concerning. °· Do not drive or use heavy machinery if you feel dizzy. °· Do not drink alcohol. °SEEK IMMEDIATE MEDICAL CARE IF:  °· Your dizziness or light-headedness gets worse. °· You feel nauseous or vomit. °· You have problems talking, walking, or using your arms, hands, or legs. °· You feel weak. °· You are not thinking clearly or you have trouble forming sentences. It may take a friend or family member to notice this. °· You have chest pain, abdominal pain, shortness of breath, or sweating. °· Your vision changes. °· You notice   any bleeding. °· You have side effects from medicine that seems to be getting worse rather than better. °MAKE SURE YOU:  °· Understand these instructions. °· Will watch your condition. °· Will get help right away if you are not doing well or get worse. °Document Released: 03/09/2001 Document Revised: 09/18/2013 Document Reviewed: 04/02/2011 °[employer]®  Patient Information ©2015 [employer]. This information is not intended to replace advice given to you by your health care provider. Make sure you discuss any questions you have with your health care provider. ° °Benign Positional Vertigo °Vertigo means you feel like you or your surroundings are moving when they are not. Benign positional vertigo is the most common form of vertigo. Benign means that the cause of your condition is not serious. Benign positional vertigo is more common in older adults. °CAUSES  °Benign positional vertigo is the result of an upset in the labyrinth system. This is an area in the middle ear that helps control your balance. This may be caused by a viral infection, head injury, or repetitive motion. However, often no specific cause is found. °SYMPTOMS  °Symptoms of benign positional vertigo occur when you move your head or eyes in different directions. Some of the symptoms may include: °· Loss of balance and falls. °· Vomiting. °· Blurred vision. °· Dizziness. °· Nausea. °· Involuntary eye movements (nystagmus). °DIAGNOSIS  °Benign positional vertigo is usually diagnosed by physical exam. If the specific cause of your benign positional vertigo is unknown, your caregiver may perform imaging tests, such as magnetic resonance imaging (MRI) or computed tomography (CT). °TREATMENT  °Your caregiver may recommend movements or procedures to correct the benign positional vertigo. Medicines such as meclizine, benzodiazepines, and medicines for nausea may be used to treat your symptoms. In rare cases, if your symptoms are caused by certain conditions that affect the inner ear, you may need surgery. °HOME CARE INSTRUCTIONS  °· Follow your caregiver's instructions. °· Move slowly. Do not make sudden body or head movements. °· Avoid driving. °· Avoid operating heavy machinery. °· Avoid performing any tasks that would be dangerous to you or others during a vertigo episode. °· Drink enough fluids to keep  your urine clear or pale yellow. °SEEK IMMEDIATE MEDICAL CARE IF:  °· You develop problems with walking, weakness, numbness, or using your arms, hands, or legs. °· You have difficulty speaking. °· You develop severe headaches. °· Your nausea or vomiting continues or gets worse. °· You develop visual changes. °· Your family or friends notice any behavioral changes. °· Your condition gets worse. °· You have a fever. °· You develop a stiff neck or sensitivity to light. °MAKE SURE YOU:  °· Understand these instructions. °· Will watch your condition. °· Will get help right away if you are not doing well or get worse. °Document Released: 06/21/2006 Document Revised: 12/06/2011 Document Reviewed: 06/03/2011 °Exit[employer]® Patient Information ©2015 [employer]. This information is not intended to replace advice given to you by your health care provider. Make sure you discuss any questions you have with your health care provider. ° °

## 2015-04-28 NOTE — Progress Notes (Signed)
   Subjective:    Patient ID: Carl Barrera, male    DOB: 15-Dec-1945, 69 y.o.   MRN: [id number]  Pt presents to the office today to establish care and for dizziness. Pt states this dizziness started on Saturday when he bent over. Pt was outside from [place] at a car show when this occurred. PT states he had a small espiode this morning, when he bent over to pick up his grandson's toys.  Dizziness This is a new problem. The current episode started in the past 7 days (Saturday). Episode frequency: Only when pt bends over. The problem has been unchanged. Pertinent negatives include no anorexia, chest pain, chills, congestion, fever, headaches, joint swelling, numbness, sore throat, visual change or weakness. The symptoms are aggravated by bending. He has tried rest for the symptoms. The treatment provided moderate relief.      Review of Systems  Constitutional: Negative.  Negative for fever and chills.  HENT: Negative.  Negative for congestion and sore throat.   Respiratory: Negative.   Cardiovascular: Negative.  Negative for chest pain.  Gastrointestinal: Negative.  Negative for anorexia.  Endocrine: Negative.   Genitourinary: Negative.   Musculoskeletal: Negative.  Negative for joint swelling.  Neurological: Positive for dizziness. Negative for weakness, numbness and headaches.  Hematological: Negative.   Psychiatric/Behavioral: Negative.   All other systems reviewed and are negative.      Objective:   Physical Exam  Constitutional: He is oriented to person, place, and time. He appears well-developed and well-nourished. No distress.  HENT:  Head: Normocephalic.  Right Ear: External ear normal.  Left Ear: External ear normal.  Nose: Nose normal.  Mouth/Throat: Oropharynx is clear and moist.  Eyes: Pupils are equal, round, and reactive to light. Right eye exhibits no discharge. Left eye exhibits no discharge.  Neck: Normal range of motion. Neck supple. No thyromegaly present.    Cardiovascular: Normal rate, regular rhythm, normal heart sounds and intact distal pulses.   No murmur heard. Pulmonary/Chest: Effort normal and breath sounds normal. No respiratory distress. He has no wheezes.  Abdominal: Soft. Bowel sounds are normal. He exhibits no distension. There is no tenderness.  Musculoskeletal: Normal range of motion. He exhibits no edema or tenderness.  Neurological: He is alert and oriented to person, place, and time. He has normal reflexes. No cranial nerve deficit.  Skin: Skin is warm and dry. No rash noted. No erythema.  Psychiatric: He has a normal mood and affect. His behavior is normal. Judgment and thought content normal.  Vitals reviewed.   BP 123/72 mmHg  Pulse 81  Temp(Src) 97.3 F (36.3 C) (Oral)  Ht $R'5\' 11"'dN$  (1.803 m)  Wt 125 lb 9.6 oz (56.972 kg)  BMI 17.53 kg/m2       Assessment & Plan:  1. Dizziness and giddiness - Anemia Profile B - CMP14+EGFR - Thyroid Panel With TSH   2. BPPV (benign paroxysmal positional vertigo), unspecified laterality  Falls precaution discussed Force fluids Avoid fast movements and positional changes Labs pending RTO prn  Evelina Dun, FNP

## 2015-04-29 ENCOUNTER — Other Ambulatory Visit: Payer: Self-pay | Admitting: Family

## 2015-04-29 LAB — ANEMIA PROFILE B
BASOS ABS: 0 10*3/uL (ref 0.0–0.2)
BASOS: 0 %
EOS (ABSOLUTE): 0.2 10*3/uL (ref 0.0–0.4)
EOS: 3 %
FOLATE: 5 ng/mL (ref >3.0)
Ferritin: 145 ng/mL (ref 30–400)
HEMATOCRIT: 47.5 % (ref 37.5–51.0)
HEMOGLOBIN: 16.5 g/dL (ref 12.6–17.7)
IRON: 49 ug/dL (ref 38–169)
Immature Grans (Abs): 0 10*3/uL (ref 0.0–0.1)
Immature Granulocytes: 0 %
Iron Saturation: 19 % (ref 15–55)
Lymphocytes Absolute: 2.7 10*3/uL (ref 0.7–3.1)
Lymphs: 33 %
MCH: 33 pg (ref 26.6–33.0)
MCHC: 34.7 g/dL (ref 31.5–35.7)
MCV: 95 fL (ref 79–97)
MONOS ABS: 0.6 10*3/uL (ref 0.1–0.9)
Monocytes: 8 %
NEUTROS ABS: 4.5 10*3/uL (ref 1.4–7.0)
Neutrophils: 56 %
PLATELETS: 227 10*3/uL (ref 150–379)
RBC: 5 x10E6/uL (ref 4.14–5.80)
RDW: 14.3 % (ref 12.3–15.4)
RETIC CT PCT: 0.8 % (ref 0.6–2.6)
Total Iron Binding Capacity: 253 ug/dL (ref 250–450)
UIBC: 204 ug/dL (ref 111–343)
Vitamin B-12: 192 pg/mL — ABNORMAL LOW (ref 211–946)
WBC: 7.9 10*3/uL (ref 3.4–10.8)

## 2015-04-29 LAB — CMP14+EGFR
ALT: 9 IU/L (ref 0–44)
AST: 13 IU/L (ref 0–40)
Albumin/Globulin Ratio: 2.1 (ref 1.1–2.5)
Albumin: 4.2 g/dL (ref 3.6–4.8)
Alkaline Phosphatase: 88 IU/L (ref 39–117)
BILIRUBIN TOTAL: 0.3 mg/dL (ref 0.0–1.2)
BUN / CREAT RATIO: 16 (ref 10–22)
BUN: 11 mg/dL (ref 8–27)
CHLORIDE: 103 mmol/L (ref 97–108)
CO2: 26 mmol/L (ref 18–29)
Calcium: 8.9 mg/dL (ref 8.6–10.2)
Creatinine, Ser: 0.67 mg/dL — ABNORMAL LOW (ref 0.76–1.27)
GFR calc non Af Amer: 98 mL/min/{1.73_m2} (ref >59)
GFR, EST AFRICAN AMERICAN: 113 mL/min/{1.73_m2} (ref >59)
Globulin, Total: 2 g/dL (ref 1.5–4.5)
Glucose: 93 mg/dL (ref 65–99)
Potassium: 3.8 mmol/L (ref 3.5–5.2)
Sodium: 143 mmol/L (ref 134–144)
Total Protein: 6.2 g/dL (ref 6.0–8.5)

## 2015-04-29 LAB — THYROID PANEL WITH TSH
FREE THYROXINE INDEX: 2.4 (ref 1.2–4.9)
T3 Uptake Ratio: 31 % (ref 24–39)
T4, Total: 7.6 ug/dL (ref 4.5–12.0)
TSH: 1.88 u[IU]/mL (ref 0.450–4.500)

## 2015-04-29 MED ORDER — TRAMADOL HCL 50 MG PO TABS: 50.0000 mg | ORAL_TABLET | Freq: Every day | ORAL | Status: DC

## 2015-04-29 NOTE — Progress Notes (Signed)
RX ready for pick up 

## 2015-05-07 ENCOUNTER — Other Ambulatory Visit: Payer: Self-pay | Admitting: *Deleted

## 2015-05-07 ENCOUNTER — Encounter: Payer: Self-pay | Admitting: Cardiology

## 2015-05-07 ENCOUNTER — Ambulatory Visit (INDEPENDENT_AMBULATORY_CARE_PROVIDER_SITE_OTHER): Payer: Medicare PPO | Admitting: Cardiology

## 2015-05-07 VITALS — BP 111/72 | HR 93 | Ht 71.0 in | Wt 125.0 lb

## 2015-05-07 DIAGNOSIS — I2510 Atherosclerotic heart disease of native coronary artery without angina pectoris: Secondary | ICD-10-CM | POA: Diagnosis not present

## 2015-05-07 DIAGNOSIS — E785 Hyperlipidemia, unspecified: Secondary | ICD-10-CM

## 2015-05-07 MED ORDER — CLOPIDOGREL BISULFATE 75 MG PO TABS: ORAL_TABLET | ORAL | Status: DC

## 2015-05-07 MED ORDER — SIMVASTATIN 80 MG PO TABS: ORAL_TABLET | ORAL | Status: DC

## 2015-05-07 MED ORDER — ISOSORBIDE MONONITRATE ER 30 MG PO TB24: ORAL_TABLET | ORAL | Status: DC

## 2015-05-07 NOTE — Progress Notes (Addendum)
HPI The patient presents for followup of his known coronary disease. Since I last saw him he has done well.  He did have one episode  He has no chest discomfort, neck or arm discomfort. There has been no new shortness of breath, PND or orthopnea. There have been no reported palpitations, presyncope or syncope. He is active in his daily living and takes care of his 70 year old autistic grandson every day and he is quite active with this.   Allergies  Allergen Reactions  . Aspirin     Rash    Current Outpatient Prescriptions  Medication Sig Dispense Refill  . clopidogrel (PLAVIX) 75 MG tablet TAKE 1 TABLET (75 MG TOTAL) BY MOUTH DAILY. 90 tablet 3  . cyanocobalamin ([facility] VITAMIN B12) 1000 MCG tablet Take 100 mcg by mouth daily.    . isosorbide mononitrate (IMDUR) 30 MG 24 hr tablet TAKE 1 TABLET (30 MG TOTAL) BY MOUTH DAILY. 90 tablet 3  . nitroGLYCERIN (NITROSTAT) 0.4 MG SL tablet USE AS DIRECTED EVERY 5 MINS. UP TO 3 X AS NEEDED 25 tablet 11  . simvastatin (ZOCOR) 80 MG tablet TAKE 1 TABLET (80 MG TOTAL) BY MOUTH AT BEDTIME. 90 tablet 1  . traMADol (ULTRAM) 50 MG tablet Take by mouth every 12 (twelve) hours as needed for moderate pain.     No current facility-administered medications for this visit.    Past Medical History  Diagnosis Date  . Cardiovascular disease     MI PTCA of a circumflex lesion in 1994. Last catheterization 2005 with stenting of the PDA stent. At that time he had 70% LAD stenosis managed medically.  . Hyperlipidemia   . Tobacco abuse     Past Surgical History  Procedure Laterality Date  . None      ROS:  Weight loss.  Otherwise as stated in the HPI and negative for all other systems.  PHYSICAL EXAM BP 111/72 mmHg  Pulse 93  Ht  (1.803 m)  Wt 125 lb (56.7 kg)  BMI 17.44 kg/m2 GENERAL:  Well appearing, thin HEENT:  Pupils equal round and reactive, fundi not visualized, oral mucosa unremarkable, dentures NECK:  No jugular venous distention,  waveform within normal limits, carotid upstroke brisk and symmetric, no bruits, no thyromegaly LUNGS:  Clear to auscultation bilaterally BACK:  No CVA tenderness CHEST:  Unremarkable HEART:  PMI not displaced or sustained,S1 and S2 within normal limits, no S3, no S4, no clicks, no rubs, no murmurs ABD:  Flat, positive bowel sounds normal in frequency in pitch, no bruits, no rebound, no guarding, no midline pulsatile mass, no hepatomegaly, no splenomegaly EXT:  2 plus pulses throughout, no edema, no cyanosis no clubbing   EKG:  Sinus rhythm, rate 93, axis within normal limits, no acute ST-T wave changes, low voltage limb leads and chest leads. Electrical alternans QTC slightly prolonged. Compared with previous chest leads voltage is slightly reduced. This probably reflects some chronic lung disease with chest expansion area  05/07/2015   ASSESSMENT AND PLAN   CAD -  The patient had a negative stress perfusion study in 2012 with an EF of 61%. He has no new symptoms. No further cardiovascular testing is suggested. We will continue with risk reduction.   Tobacco abuse -  We talked about this again.  He has been through different stop smoking techniques which have not worked.  He does not want to try them again. He understands the importance of not smoking   Hyperlipidemia -  I will order a lipid profile. He's been tolerating a 80 milligrams Zocor for years so there is no indication to change.   Weight loss - He has lost 20 lbs in 3 years.However, this has been stable since last visit. No change in therapy is indicated.

## 2015-05-07 NOTE — Patient Instructions (Signed)
Medication Instructions:  Your physician recommends that you continue on your current medications as directed. Please refer to the Current Medication list given to you today.  Labwork: Please have a Lipid profile at  [facility].  Follow-Up: Follow up in 1 year with Dr. Antoine Poche in [place].  You will receive a letter in the mail 2 months before you are due.  Please call us when you receive this letter to schedule your follow up appointment.  Thank you for choosing  [facility]!!

## 2015-05-08 ENCOUNTER — Other Ambulatory Visit (INDEPENDENT_AMBULATORY_CARE_PROVIDER_SITE_OTHER): Payer: Medicare PPO

## 2015-05-08 DIAGNOSIS — I2510 Atherosclerotic heart disease of native coronary artery without angina pectoris: Secondary | ICD-10-CM

## 2015-05-08 DIAGNOSIS — E785 Hyperlipidemia, unspecified: Secondary | ICD-10-CM

## 2015-05-09 ENCOUNTER — Other Ambulatory Visit: Payer: Self-pay | Admitting: *Deleted

## 2015-05-09 LAB — LIPID PANEL
CHOLESTEROL TOTAL: 86 mg/dL — AB (ref 100–199)
Chol/HDL Ratio: 2.6 ratio units (ref 0.0–5.0)
HDL: 33 mg/dL — ABNORMAL LOW (ref >39)
LDL Calculated: 40 mg/dL (ref 0–99)
TRIGLYCERIDES: 67 mg/dL (ref 0–149)
VLDL Cholesterol Cal: 13 mg/dL (ref 5–40)

## 2015-05-13 ENCOUNTER — Encounter: Payer: Self-pay | Admitting: *Deleted

## 2015-05-22 ENCOUNTER — Other Ambulatory Visit: Payer: Self-pay

## 2015-05-22 NOTE — Telephone Encounter (Signed)
Approved      Disp Refills Start End    simvastatin (ZOCOR) 80 MG tablet 90 tablet 1 05/07/2015     Sig:  TAKE 1 TABLET (80 MG TOTAL) BY MOUTH AT BEDTIME.    Class:  Normal    DAW:  No    Authorizing Provider:  Rollene Rotunda, MD    Ordering User:  Freddi Starr, RN    isosorbide mononitrate (IMDUR) 30 MG 24 hr tablet 90 tablet 3 05/07/2015     Sig:  TAKE 1 TABLET (30 MG TOTAL) BY MOUTH DAILY.    Class:  Normal    DAW:  No    Authorizing Provider:  Rollene Rotunda, MD    Ordering User:  Freddi Starr, RN    clopidogrel (PLAVIX) 75 MG tablet 90 tablet 3 05/07/2015     Sig:  TAKE 1 TABLET (75 MG TOTAL) BY MOUTH DAILY.    Class:  Normal    DAW:  No    Authorizing Provider:  Rollene Rotunda, MD    Ordering User:  Freddi Starr, RN

## 2015-10-13 ENCOUNTER — Encounter: Payer: Self-pay | Admitting: Family Medicine

## 2015-10-13 ENCOUNTER — Ambulatory Visit (INDEPENDENT_AMBULATORY_CARE_PROVIDER_SITE_OTHER): Payer: Medicare Other | Admitting: Family Medicine

## 2015-10-13 VITALS — BP 100/59 | HR 63 | Temp 96.9°F | Ht 71.0 in | Wt 122.4 lb

## 2015-10-13 DIAGNOSIS — B86 Scabies: Secondary | ICD-10-CM | POA: Diagnosis not present

## 2015-10-13 MED ORDER — LEVOCETIRIZINE DIHYDROCHLORIDE 5 MG PO TABS: 5.0000 mg | ORAL_TABLET | Freq: Every day | ORAL | Status: DC

## 2015-10-13 MED ORDER — ALBENDAZOLE 200 MG PO TABS: 400.0000 mg | ORAL_TABLET | Freq: Two times a day (BID) | ORAL | Status: DC

## 2015-10-13 NOTE — Progress Notes (Signed)
Subjective:  Patient ID: Carl Barrera, male    DOB: 03/07/1946  Age: 70 y.o. MRN: [id number]  CC: Pruritis   HPI Carl Reeveaul R Viernes presents for itching terribly. Grandson scratches his fingers, etc dx with scabies. Now pt wife is itching. Her MD sent her to derm since scabies tx didn't work. Apparently used a permethrin-containing product. Patient states his itching is on abdomen and chest and extremities and on to the hands  History Renae Fickleaul has a past medical history of Cardiovascular disease; Hyperlipidemia; and Tobacco abuse.   He has past surgical history that includes None.   His family history includes Stroke in his mother.He reports that he has been smoking Cigars.  He does not have any smokeless tobacco history on file. He reports that he does not drink alcohol. His drug history is not on file.  Outpatient Prescriptions Prior to Visit  Medication Sig Dispense Refill  . clopidogrel (PLAVIX) 75 MG tablet TAKE 1 TABLET (75 MG TOTAL) BY MOUTH DAILY. 90 tablet 3  . cyanocobalamin ([facility] VITAMIN B12) 1000 MCG tablet Take 100 mcg by mouth daily.    . isosorbide mononitrate (IMDUR) 30 MG 24 hr tablet TAKE 1 TABLET (30 MG TOTAL) BY MOUTH DAILY. 90 tablet 3  . nitroGLYCERIN (NITROSTAT) 0.4 MG SL tablet USE AS DIRECTED EVERY 5 MINS. UP TO 3 X AS NEEDED 25 tablet 11  . simvastatin (ZOCOR) 80 MG tablet TAKE 1 TABLET (80 MG TOTAL) BY MOUTH AT BEDTIME. 90 tablet 1  . traMADol (ULTRAM) 50 MG tablet Take by mouth every 12 (twelve) hours as needed for moderate pain.     No facility-administered medications prior to visit.    ROS Review of Systems  Constitutional: Negative for fever, chills and diaphoresis.  HENT: Negative for rhinorrhea and sore throat.   Respiratory: Negative for cough and shortness of breath.   Gastrointestinal: Negative for abdominal pain.  Musculoskeletal: Negative for myalgias and arthralgias.  Skin: Positive for rash.  Neurological: Negative for headaches.    Objective:    BP 100/59 mmHg  Pulse 63  Temp(Src) 96.9 F (36.1 C) (Oral)  Ht 5\' 11"  (1.803 m)  Wt 122 lb 6.4 oz (55.52 kg)  BMI 17.08 kg/m2  BP Readings from Last 3 Encounters:  10/13/15 100/59  05/07/15 111/72  04/28/15 123/72    Wt Readings from Last 3 Encounters:  10/13/15 122 lb 6.4 oz (55.52 kg)  05/07/15 125 lb (56.7 kg)  04/28/15 125 lb 9.6 oz (56.972 kg)     Physical Exam  Constitutional: He appears well-developed and well-nourished.  HENT:  Head: Normocephalic and atraumatic.  Right Ear: External ear normal.  Left Ear: External ear normal.  Mouth/Throat: No oropharyngeal exudate or posterior oropharyngeal erythema.  Eyes: Pupils are equal, round, and reactive to light.  Neck: Normal range of motion. Neck supple.  Cardiovascular: Normal rate and regular rhythm.   No murmur heard. Pulmonary/Chest: Breath sounds normal. No respiratory distress.  Abdominal: Bowel sounds are normal.  Skin: Rash (there is papular erythema with excoriations over the abdomen scantly over the chest none in the scalp or face area. Few of the forearms as well as the thighs. There are multiple burrows noted as well.) noted.  Vitals reviewed.    Lab Results  Component Value Date   WBC 7.9 04/28/2015   HCT 47.5 04/28/2015   PLT 227 04/28/2015   GLUCOSE 93 04/28/2015   CHOL 86* 05/08/2015   TRIG 67 05/08/2015   HDL 33* 05/08/2015  LDLCALC 40 05/08/2015   ALT 9 04/28/2015   AST 13 04/28/2015   NA 143 04/28/2015   K 3.8 04/28/2015   CL 103 04/28/2015   CREATININE 0.67* 04/28/2015   BUN 11 04/28/2015   CO2 26 04/28/2015   TSH 1.880 04/28/2015    Dg Chest 2 View  09/02/2010  Clinical Data: Follow-up probable granulomas.  CHEST - 2 VIEW  Comparison: 06/03/2010  Findings: Trachea is midline.  Heart size normal.  Emphysema. Nodular densities seen in the upper lung zones on the prior study are not as well seen today.  No pleural fluid.  Degenerative changes are seen in the mid and lower thoracic  spine.  IMPRESSION: No acute findings. Provider: [place]   Assessment & Plan:   Jarome was seen today for pruritis.  Diagnoses and all orders for this visit:  Scabies  Other orders -     albendazole (ALBENZA) 200 MG tablet; Take 2 tablets (400 mg total) by mouth 2 (two) times daily. For 2 weeks for skin infection -     levocetirizine (XYZAL) 5 MG tablet; Take 1 tablet (5 mg total) by mouth daily. For itching relief  I have discontinued Mr. Klutz traMADol. I am also having him start on albendazole and levocetirizine. Additionally, I am having him maintain his nitroGLYCERIN, cyanocobalamin, simvastatin, isosorbide mononitrate, and clopidogrel.  Meds ordered this encounter  Medications  . albendazole (ALBENZA) 200 MG tablet    Sig: Take 2 tablets (400 mg total) by mouth 2 (two) times daily. For 2 weeks for skin infection    Dispense:  56 tablet    Refill:  0  . levocetirizine (XYZAL) 5 MG tablet    Sig: Take 1 tablet (5 mg total) by mouth daily. For itching relief    Dispense:  30 tablet    Refill:  2     Follow-up: Return if symptoms worsen or fail to improve.  Mechele Claude, M.D.

## 2015-10-23 ENCOUNTER — Ambulatory Visit (INDEPENDENT_AMBULATORY_CARE_PROVIDER_SITE_OTHER): Payer: Medicare Other | Admitting: Family

## 2015-10-23 ENCOUNTER — Encounter: Payer: Self-pay | Admitting: Family

## 2015-10-23 VITALS — BP 129/75 | HR 72 | Temp 96.8°F | Ht 71.0 in | Wt 121.4 lb

## 2015-10-23 DIAGNOSIS — B86 Scabies: Secondary | ICD-10-CM

## 2015-10-23 MED ORDER — PERMETHRIN 5 % EX CREA: TOPICAL_CREAM | CUTANEOUS | Status: DC

## 2015-10-23 NOTE — Progress Notes (Signed)
   Subjective:    Patient ID: Carl Barrera, male    DOB: [birth year], 70 y.o.   MRN: [id number]  PT presents to the office today with recurrent rash. Pt was he was seen in the office on 10/13/15 and given rx of albenza with no relief. PT states his wife is having the same symptoms. Pt states his grandson was treated for scabies 3-4 weeks ago.  Rash This is a recurrent problem. The current episode started 1 to 4 weeks ago. The problem is unchanged. The affected locations include the head, neck, back, abdomen, groin, right arm, right upper leg, right lowerleg, left lower leg and left upper leg. The rash is characterized by itchiness and redness. Pertinent negatives include no congestion, eye pain, fever, joint pain, rhinorrhea or shortness of breath. Treatments tried: pt was given rx albenza. The treatment provided no relief. There is no history of asthma or eczema.      Review of Systems  Constitutional: Negative.  Negative for fever.  HENT: Negative.  Negative for congestion and rhinorrhea.   Eyes: Negative for pain.  Respiratory: Negative.  Negative for shortness of breath.   Cardiovascular: Negative.   Gastrointestinal: Negative.   Endocrine: Negative.   Genitourinary: Negative.   Musculoskeletal: Negative.  Negative for joint pain.  Skin: Positive for rash.  Neurological: Negative.   Hematological: Negative.   Psychiatric/Behavioral: Negative.   All other systems reviewed and are negative.      Objective:   Physical Exam  Constitutional: He is oriented to person, place, and time. He appears well-developed and well-nourished. No distress.  HENT:  Head: Normocephalic.  Eyes: Pupils are equal, round, and reactive to light. Right eye exhibits no discharge. Left eye exhibits no discharge.  Neck: Normal range of motion. Neck supple. No thyromegaly present.  Cardiovascular: Normal rate, regular rhythm, normal heart sounds and intact distal pulses.   No murmur heard. Pulmonary/Chest:  Effort normal and breath sounds normal. No respiratory distress. He has no wheezes.  Abdominal: Soft. Bowel sounds are normal. He exhibits no distension. There is no tenderness.  Musculoskeletal: Normal range of motion. He exhibits no edema or tenderness.  Neurological: He is alert and oriented to person, place, and time. He has normal reflexes. No cranial nerve deficit.  Skin: Skin is warm and dry. Rash noted. There is erythema.  Erythemas pimples on inner thigh, waist line, with linear tracks present  Psychiatric: He has a normal mood and affect. His behavior is normal. Judgment and thought content normal.  Vitals reviewed.   BP 129/75 mmHg  Pulse 72  Temp(Src) 96.8 F (36 C) (Oral)  Ht  (1.803 m)  Wt 121 lb 6.4 oz (55.067 kg)  BMI 16.94 kg/m2       Assessment & Plan:  1. Scabies Do not scratch -Wash all clothing and bedding in HOT water -Place all unwashable ites into closed airtight plastic bags for at least 3 days  - RX given for wife to be treated -RTO prn  - permethrin (ACTICIN) 5 % cream; Apply to whole body for 8-14 hours then wash off  Dispense: 60 g; Refill: 0  Jannifer Rodney, FNP

## 2015-10-23 NOTE — Patient Instructions (Signed)

## 2015-11-24 ENCOUNTER — Other Ambulatory Visit: Payer: Self-pay | Admitting: *Deleted

## 2015-11-24 MED ORDER — SIMVASTATIN 80 MG PO TABS: ORAL_TABLET | ORAL | Status: DC

## 2015-11-24 MED ORDER — CLOPIDOGREL BISULFATE 75 MG PO TABS: ORAL_TABLET | ORAL | Status: DC

## 2015-11-24 MED ORDER — ISOSORBIDE MONONITRATE ER 30 MG PO TB24: ORAL_TABLET | ORAL | Status: DC

## 2015-11-24 NOTE — Telephone Encounter (Signed)
REFILL 

## 2015-12-07 ENCOUNTER — Emergency Department (HOSPITAL_COMMUNITY)
Admission: EM | Admit: 2015-12-07 | Discharge: 2015-12-07 | Disposition: A | Discharge status: Home / Self Care | Payer: Medicare Other | Attending: Emergency Medicine | Admitting: Emergency Medicine

## 2015-12-07 ENCOUNTER — Encounter (HOSPITAL_COMMUNITY): Payer: Self-pay | Admitting: Family Medicine

## 2015-12-07 ENCOUNTER — Emergency Department (HOSPITAL_COMMUNITY): Payer: Medicare Other

## 2015-12-07 DIAGNOSIS — R05 Cough: Secondary | ICD-10-CM

## 2015-12-07 DIAGNOSIS — Z8679 Personal history of other diseases of the circulatory system: Secondary | ICD-10-CM | POA: Diagnosis not present

## 2015-12-07 DIAGNOSIS — F17210 Nicotine dependence, cigarettes, uncomplicated: Secondary | ICD-10-CM | POA: Insufficient documentation

## 2015-12-07 DIAGNOSIS — J159 Unspecified bacterial pneumonia: Secondary | ICD-10-CM | POA: Diagnosis not present

## 2015-12-07 DIAGNOSIS — E785 Hyperlipidemia, unspecified: Secondary | ICD-10-CM | POA: Insufficient documentation

## 2015-12-07 DIAGNOSIS — Z79899 Other long term (current) drug therapy: Secondary | ICD-10-CM | POA: Insufficient documentation

## 2015-12-07 DIAGNOSIS — J45909 Unspecified asthma, uncomplicated: Secondary | ICD-10-CM | POA: Insufficient documentation

## 2015-12-07 DIAGNOSIS — Z7902 Long term (current) use of antithrombotics/antiplatelets: Secondary | ICD-10-CM | POA: Diagnosis not present

## 2015-12-07 DIAGNOSIS — J189 Pneumonia, unspecified organism: Secondary | ICD-10-CM

## 2015-12-07 DIAGNOSIS — R059 Cough, unspecified: Secondary | ICD-10-CM

## 2015-12-07 DIAGNOSIS — R042 Hemoptysis: Secondary | ICD-10-CM | POA: Diagnosis present

## 2015-12-07 HISTORY — DX: Unspecified asthma, uncomplicated: J45.909

## 2015-12-07 LAB — CBC WITH DIFFERENTIAL/PLATELET
BASOS ABS: 0 10*3/uL (ref 0.0–0.1)
BASOS PCT: 0 %
Eosinophils Absolute: 0.1 10*3/uL (ref 0.0–0.7)
Eosinophils Relative: 1 %
HEMATOCRIT: 49 % (ref 39.0–52.0)
Hemoglobin: 16.6 g/dL (ref 13.0–17.0)
LYMPHS PCT: 20 %
Lymphs Abs: 2.3 10*3/uL (ref 0.7–4.0)
MCH: 33 pg (ref 26.0–34.0)
MCHC: 33.9 g/dL (ref 30.0–36.0)
MCV: 97.4 fL (ref 78.0–100.0)
Monocytes Absolute: 0.7 10*3/uL (ref 0.1–1.0)
Monocytes Relative: 7 %
NEUTROS ABS: 8 10*3/uL — AB (ref 1.7–7.7)
NEUTROS PCT: 72 %
Platelets: 199 10*3/uL (ref 150–400)
RBC: 5.03 MIL/uL (ref 4.22–5.81)
RDW: 13.2 % (ref 11.5–15.5)
WBC: 11.2 10*3/uL — AB (ref 4.0–10.5)

## 2015-12-07 LAB — COMPREHENSIVE METABOLIC PANEL
ALBUMIN: 3.7 g/dL (ref 3.5–5.0)
ALT: 16 U/L — AB (ref 17–63)
AST: 18 U/L (ref 15–41)
Alkaline Phosphatase: 92 U/L (ref 38–126)
Anion gap: 9 (ref 5–15)
BILIRUBIN TOTAL: 0.8 mg/dL (ref 0.3–1.2)
BUN: 10 mg/dL (ref 6–20)
CHLORIDE: 104 mmol/L (ref 101–111)
CO2: 29 mmol/L (ref 22–32)
CREATININE: 0.83 mg/dL (ref 0.61–1.24)
Calcium: 9.2 mg/dL (ref 8.9–10.3)
GFR calc Af Amer: >60 mL/min (ref >60)
GLUCOSE: 119 mg/dL — AB (ref 65–99)
POTASSIUM: 4.1 mmol/L (ref 3.5–5.1)
Sodium: 142 mmol/L (ref 135–145)
Total Protein: 6.9 g/dL (ref 6.5–8.1)

## 2015-12-07 LAB — URINALYSIS, ROUTINE W REFLEX MICROSCOPIC
BILIRUBIN URINE: NEGATIVE
GLUCOSE, UA: NEGATIVE mg/dL
HGB URINE DIPSTICK: NEGATIVE
Ketones, ur: NEGATIVE mg/dL
Nitrite: NEGATIVE
Protein, ur: NEGATIVE mg/dL
SPECIFIC GRAVITY, URINE: 1.019 (ref 1.005–1.030)
pH: 5 (ref 5.0–8.0)

## 2015-12-07 LAB — URINE MICROSCOPIC-ADD ON

## 2015-12-07 MED ORDER — IOHEXOL 350 MG/ML SOLN
100.0000 mL | Freq: Once | INTRAVENOUS | Status: AC | PRN
  Administered 2015-12-07: 100 mL via INTRAVENOUS

## 2015-12-07 MED ORDER — DOXYCYCLINE HYCLATE 100 MG PO CAPS: 100.0000 mg | ORAL_CAPSULE | Freq: Two times a day (BID) | ORAL | Status: AC

## 2015-12-07 NOTE — ED Provider Notes (Signed)
Patient presents with cough for the past few weeks. Also has had nasal congestion and sinus congestion. Has had occasional blood when he blows his nose. Patient noticed small amount of blood when he coughed yesterday. It was mixed with sputum. Patient did have a small amount of red blood today while coughing. No shortness of breath. Lungs are clear. Vital signs are stable. Patient does have some irritation to the right side of the nasal septum without any obvious bleed. Labs and chest x-ray initiated. Patient continued to take Plavix for his coronary artery disease status post stenting.  Loren Raceravid Varvara Legault, MD 12/07/15 1447

## 2015-12-07 NOTE — ED Notes (Signed)
Pt placed on monitor upon arrival to room. Pt monitored by blood pressure and pulse ox. Pt is noted to have visitors at bedside.

## 2015-12-07 NOTE — ED Notes (Signed)
Patient's pulse oximetry decreased from 94% to 88% upon ambulation down the hallway

## 2015-12-07 NOTE — ED Notes (Signed)
Patient transported to X-ray 

## 2015-12-07 NOTE — ED Provider Notes (Signed)
CSN: [id number]     Arrival date & time 12/07/15  1305 History   First MD Initiated Contact with Patient 12/07/15 1505     Chief Complaint  Patient presents with  . Cough  . Hemoptysis      HPI  70 y.o. male with history of this MI in 1994 and HLD presenting with cough 5 days as well as hemoptysis times one day. Patient reports dry cough over the last 6 weeks with productive of clear sputum. No chest pain or shortness of breath. Yesterday patient began coughing up streaks of blood, and today reports coughing up several quarter-sized amounts of blood. He denies chest tightness, wheezing, stridor, or respiratory distress. No fevers, abdominal pain, nausea, vomiting, or urinary symptoms. He has been smoking for 50 years and reports a 40 pound weight loss over the last year.   Past Medical History  Diagnosis Date  . Cardiovascular disease     MI PTCA of a circumflex lesion in 1994. Last catheterization 2005 with stenting of the PDA stent. At that time he had 70% LAD stenosis managed medically.  . Hyperlipidemia   . Tobacco abuse   . Asthma    Past Surgical History  Procedure Laterality Date  . None     Family History  Problem Relation Age of Onset  . Stroke Mother    Social History  Substance Use Topics  . Smoking status: Current Every Day Smoker -- 1.00 packs/day for 53 years    Types: Cigars  . Smokeless tobacco: None     Comment: He cannot quit.  He smokes small cigars.  . Alcohol Use: No    Review of Systems  Constitutional: Negative for fever, chills, activity change and appetite change.  HENT: Negative for congestion, facial swelling, rhinorrhea and sore throat.   Eyes: Negative for visual disturbance.  Respiratory: Positive for cough. Negative for chest tightness, shortness of breath and wheezing.   Cardiovascular: Negative for chest pain, palpitations and leg swelling.  Gastrointestinal: Negative for nausea, vomiting, abdominal pain, diarrhea, constipation and blood  in stool.  Genitourinary: Negative for dysuria, frequency, hematuria, flank pain and difficulty urinating.  Musculoskeletal: Negative for myalgias, back pain, joint swelling, arthralgias, neck pain and neck stiffness.  Skin: Negative for rash.  Neurological: Negative for dizziness, weakness, light-headedness and headaches.  Psychiatric/Behavioral: Negative for behavioral problems, confusion and agitation.      Allergies  Aspirin  Home Medications   Prior to Admission medications   Medication Sig Start Date End Date Taking? Authorizing Provider  albendazole (ALBENZA) 200 MG tablet Take 2 tablets (400 mg total) by mouth 2 (two) times daily. For 2 weeks for skin infection 10/13/15   Mechele Claude, MD  clopidogrel (PLAVIX) 75 MG tablet TAKE 1 TABLET (75 MG TOTAL) BY MOUTH DAILY. 11/24/15   Rollene Rotunda, MD  cyanocobalamin ([facility] VITAMIN B12) 1000 MCG tablet Take 100 mcg by mouth daily.    Historical Provider, MD  doxycycline (VIBRAMYCIN) 100 MG capsule Take 1 capsule (100 mg total) by mouth 2 (two) times daily. 12/07/15 12/16/15  Jenifer Ernestina Penna, MD  isosorbide mononitrate (IMDUR) 30 MG 24 hr tablet TAKE 1 TABLET (30 MG TOTAL) BY MOUTH DAILY. 11/24/15   Rollene Rotunda, MD  levocetirizine (XYZAL) 5 MG tablet Take 1 tablet (5 mg total) by mouth daily. For itching relief 10/13/15   Mechele Claude, MD  nitroGLYCERIN (NITROSTAT) 0.4 MG SL tablet USE AS DIRECTED EVERY 5 MINS. UP TO 3 X AS NEEDED 04/24/14  Rollene Rotunda, MD  permethrin (ACTICIN) 5 % cream Apply to whole body for 8-14 hours then wash off 10/23/15   Junie Spencer, FNP  simvastatin (ZOCOR) 80 MG tablet TAKE 1 TABLET (80 MG TOTAL) BY MOUTH AT BEDTIME. 11/24/15   Rollene Rotunda, MD   BP 129/79 mmHg  Pulse 91  Temp(Src) 97.9 F (36.6 C) (Oral)  Resp 18  SpO2 94% Physical Exam  Constitutional: He is oriented to person, place, and time. He appears well-developed and well-nourished. No distress.  HENT:  Head: Normocephalic and  atraumatic.  Right Ear: External ear normal.  Left Ear: External ear normal.  Nose: Nose normal.  Mouth/Throat: Oropharynx is clear and moist. No oropharyngeal exudate.  Eyes: Conjunctivae are normal. Pupils are equal, round, and reactive to light. Right eye exhibits no discharge. Left eye exhibits no discharge. No scleral icterus.  Neck: Normal range of motion. Neck supple. No tracheal deviation present.  Cardiovascular: Normal rate, regular rhythm and normal heart sounds.  Exam reveals no gallop and no friction rub.   No murmur heard. Pulmonary/Chest: Effort normal and breath sounds normal. No respiratory distress. He has no wheezes. He has no rales.  Breath sounds diminished at the bilateral bases  Abdominal: Soft. Bowel sounds are normal. He exhibits no distension and no mass. There is no tenderness. There is no rebound and no guarding.  Musculoskeletal: Normal range of motion. He exhibits no edema or tenderness.  Neurological: He is alert and oriented to person, place, and time. No cranial nerve deficit. He exhibits normal muscle tone.  Skin: Skin is warm and dry. No rash noted. He is not diaphoretic.  Psychiatric: He has a normal mood and affect. His behavior is normal. Judgment and thought content normal.    ED Course  Procedures (including critical care time) Labs Review Labs Reviewed  CBC WITH DIFFERENTIAL/PLATELET - Abnormal; Notable for the following:    WBC 11.2 (*)    Neutro Abs 8.0 (*)    All other components within normal limits  COMPREHENSIVE METABOLIC PANEL - Abnormal; Notable for the following:    Glucose, Bld 119 (*)    ALT 16 (*)    All other components within normal limits  URINALYSIS, ROUTINE W REFLEX MICROSCOPIC (NOT AT [facility]) - Abnormal; Notable for the following:    Leukocytes, UA SMALL (*)    All other components within normal limits  URINE MICROSCOPIC-ADD ON - Abnormal; Notable for the following:    Squamous Epithelial / LPF 0-5 (*)    Bacteria, UA FEW (*)     All other components within normal limits    Imaging Review Dg Chest 2 View  12/07/2015  CLINICAL DATA:  70 year old male with history of cough and hemoptysis. EXAM: CHEST  2 VIEW COMPARISON:  Chest x-ray 09/02/2010. FINDINGS: Air-fluid level noted in the medial aspect of the left hemithorax anteriorly. There appears to be a small amount of airspace consolidation adjacent to this in the lingula. Extensive emphysematous changes throughout the lungs bilaterally. Right lung appears clear. Diffuse peribronchial cuffing. Blunting of the costophrenic sulci bilaterally may indicate small bilateral pleural effusions or may simply be related to hyperexpansion and associated chronic pleural parenchymal thickening. No evidence of pulmonary edema. Heart size is normal. Upper mediastinal contours are within normal limits. Atherosclerosis in the thoracic aorta. IMPRESSION: 1. Unusual appearance with air-fluid level in the medial aspect if the left hemithorax anteriorly. This is favored to reflect fluid within a medial bulla off the lingula, adjacent to an  area of airspace consolidation, but is poorly evaluated on this plain film examination. Given the patient's history of hemoptysis, further evaluation with contrast enhanced chest CT is suggested. 2. Atherosclerosis. 3. Advanced emphysematous changes and diffuse peribronchial cuffing, suggestive of underlying COPD. 4. Small bilateral pleural effusions versus chronic pleuroparenchymal scarring. These results were called by telephone at the time of interpretation on 12/07/2015 at 3:09 pm to Dr. Ranae PalmsYelverton, who verbally acknowledged these results. Electronically Signed   By: Trudie Reedaniel  Entrikin M.D.   On: 12/07/2015 15:10   Ct Angio Chest Pe W/cm &/or Wo Cm  12/07/2015  CLINICAL DATA:  70 year old male history of hemoptysis. EXAM: CT ANGIOGRAPHY CHEST WITH CONTRAST TECHNIQUE: Multidetector CT imaging of the chest was performed using the standard protocol during bolus  administration of intravenous contrast. Multiplanar CT image reconstructions and MIPs were obtained to evaluate the vascular anatomy. CONTRAST:  100mL OMNIPAQUE IOHEXOL 350 MG/ML SOLN COMPARISON:  No priors. FINDINGS: Mediastinum/Lymph Nodes: No filling defects within the pulmonary arterial tree to suggest underlying pulmonary embolism. Heart size is normal. There is no significant pericardial fluid, thickening or pericardial calcification. There is atherosclerosis of the thoracic aorta, the great vessels of the mediastinum and the coronary arteries, including calcified atherosclerotic plaque in the left main, left anterior descending, left circumflex and right coronary arteries. No pathologically enlarged mediastinal or hilar lymph nodes. Esophagus is unremarkable in appearance. No axillary lymphadenopathy. Lungs/Pleura: There is diffuse bronchial wall thickening with severe centrilobular and paraseptal emphysema, including widespread bullous disease, most pronounced in the anterior aspect of the thorax bilaterally. In the lingula there are some irregular areas of airspace consolidation, which in the setting of hemoptysis likely reflect areas of alveolar hemorrhage. This is most confluent in the inferior segment of the lingula. However, the densest areas of airspace consolidation appear to be in the perihilar aspect of the left upper lobe, particularly on image 88 of series 5. Scattered areas of mild cylindrical bronchiectasis are noted, including in the posterior aspect of the left upper lobe. No pleural effusions. Areas of linear scarring are noted in the lung bases bilaterally. Upper Abdomen: Large low-attenuation lesion in the upper left retroperitoneum is incompletely visualized and therefore not characterized, but favored to represent a large exophytic left renal cyst. Atherosclerosis. Musculoskeletal/Soft Tissues: There are no aggressive appearing lytic or blastic lesions noted in the visualized portions of  the skeleton. Review of the MIP images confirms the above findings. IMPRESSION: 1. No evidence of pulmonary embolism. 2. Scattered airspace disease throughout the left upper lobe, presumably reflective of alveolar hemorrhage. This is most extensive in the inferior segment of the lingula, however, the most confluent area of consolidation is in the perihilar aspect of the left upper lobe. 3. Diffuse bronchial wall thickening with severe centrilobular and paraseptal emphysema. 4. There are few scattered areas of mild cylindrical bronchiectasis in the lungs bilaterally. 5. Atherosclerosis, including left main and 3 vessel coronary artery disease. Please note that although the presence of coronary artery calcium documents the presence of coronary artery disease, the severity of this disease and any potential stenosis cannot be assessed on this non-gated CT examination. Assessment for potential risk factor modification, dietary therapy or pharmacologic therapy may be warranted, if clinically indicated. 6. Additional incidental findings, as above. Electronically Signed   By: Trudie Reedaniel  Entrikin M.D.   On: 12/07/2015 17:06   I have personally reviewed and evaluated these images and lab results as part of my medical decision-making.   EKG Interpretation None  MDM   Final diagnoses:  Hemoptysis  CAP (community acquired pneumonia)   Labs with mild leukocytosis 11.2. Hemoglobin within normal limits. CMP unremarkable. CT of the chest performed without evidence of pulmonary embolism. No evidence of malignancy. CT does reveal scattered airspace disease throughout the left upper lobe, likely reflective of alveolar hemorrhage. There is diffuse bronchial wall thickening and paraseptal emphysema. Suspect CAP as the cause of the patient's symptoms, will cover with course of doxycycline.  Strict precautions given to return to the ED for any shortness of breath, chest pain, or increased hemoptysis.     Jenifer Ernestina Penna, MD 12/07/15 1810  Derwood Kaplan, MD 12/10/15 1250

## 2015-12-07 NOTE — ED Notes (Signed)
Pt here for hemoptysis, weakness and loss of appetite.

## 2015-12-07 NOTE — Discharge Instructions (Signed)

## 2016-05-25 ENCOUNTER — Encounter: Payer: Self-pay | Admitting: Cardiology

## 2016-06-08 NOTE — Progress Notes (Signed)
HPI The patient presents for followup of his known coronary disease. Since I last saw him he has done well.  He is active taking care of his house and his yard and a 71-year-old autistic grandson.  The patient denies any new symptoms such as chest discomfort, neck or arm discomfort. There has been no new, PND or orthopnea. There have been no reported palpitations, presyncope or syncope.  He does have chronic dyspnea exertion which he relates to his tobacco use. He did have pneumonia in March. He's been unable to quit smoking and doesn't want to consider it further.  Allergies  Allergen Reactions  . Aspirin     Rash    Current Outpatient Prescriptions  Medication Sig Dispense Refill  . clopidogrel (PLAVIX) 75 MG tablet TAKE 1 TABLET (75 MG TOTAL) BY MOUTH DAILY. 90 tablet 3  . cyanocobalamin ([employer] VITAMIN B12) 1000 MCG tablet Take 100 mcg by mouth daily.    . isosorbide mononitrate (IMDUR) 30 MG 24 hr tablet TAKE 1 TABLET (30 MG TOTAL) BY MOUTH DAILY. 90 tablet 3  . nitroGLYCERIN (NITROSTAT) 0.4 MG SL tablet USE AS DIRECTED EVERY 5 MINS. UP TO 3 X AS NEEDED 25 tablet 11  . simvastatin (ZOCOR) 80 MG tablet TAKE 1 TABLET (80 MG TOTAL) BY MOUTH AT BEDTIME. 90 tablet 3   No current facility-administered medications for this visit.     Past Medical History:  Diagnosis Date  . Asthma   . Cardiovascular disease    MI PTCA of a circumflex lesion in 1994. Last catheterization 2005 with stenting of the PDA stent. At that time he had 70% LAD stenosis managed medically.  . Hyperlipidemia   . Tobacco abuse     Past Surgical History:  Procedure Laterality Date  . None      ROS:  As stated in the HPI and negative for all other systems.  PHYSICAL EXAM BP 102/60   Pulse 90   Ht 5\' 11"  (1.803 m)   Wt 120 lb (54.4 kg)   BMI 16.74 kg/m  GENERAL:  Well appearing, thin HEENT:  Pupils equal round and reactive, fundi not visualized, oral mucosa unremarkable, dentures NECK:  No jugular venous  distention, waveform within normal limits, carotid upstroke brisk and symmetric, no bruits, no thyromegaly LUNGS:  Clear to auscultation bilaterally BACK:  No CVA tenderness CHEST:  Unremarkable HEART:  PMI not displaced or sustained,S1 and S2 within normal limits, no S3, no S4, no clicks, no rubs, no murmurs ABD:  Flat, positive bowel sounds normal in frequency in pitch, no bruits, no rebound, no guarding, no midline pulsatile mass, no hepatomegaly, no splenomegaly EXT:  2 plus pulses throughout, no edema, no cyanosis no clubbing   EKG:  Sinus rhythm, rate 90, axis within normal limits, no acute ST-T wave changes, low voltage limb leads and chest leads. Electrical alternans.Compared with previous chest leads voltage is slightly reduced. This probably reflects some chronic lung disease with chest expansion area  06/09/2016   ASSESSMENT AND PLAN   CAD -  The patient had a negative stress perfusion study in 2012 with an EF of 61%. He has no new symptoms. I will screen with a treadmill but he doesn't think he be able to walk because of his chronic lung disease and he doesn't want to incur any expense. He declined having stress testing.  Tobacco abuse -  We talked about this again.  He has been through different stop smoking techniques which have not worked.  He does not want to try them again. He understands the importance of not smoking   Hyperlipidemia - I will order a lipid profile. He's been tolerating a 80 milligrams Zocor for years so there is no indication to change.   Weight loss - He has lost 20 lbs in 3 years.However, this has been stable since last visit. No change in therapy is indicated.

## 2016-06-09 ENCOUNTER — Ambulatory Visit (INDEPENDENT_AMBULATORY_CARE_PROVIDER_SITE_OTHER): Payer: Medicare Other | Admitting: Cardiology

## 2016-06-09 ENCOUNTER — Encounter: Payer: Self-pay | Admitting: Cardiology

## 2016-06-09 VITALS — BP 102/60 | HR 90 | Ht 71.0 in | Wt 120.0 lb

## 2016-06-09 DIAGNOSIS — I2510 Atherosclerotic heart disease of native coronary artery without angina pectoris: Secondary | ICD-10-CM | POA: Diagnosis not present

## 2016-06-09 DIAGNOSIS — E785 Hyperlipidemia, unspecified: Secondary | ICD-10-CM | POA: Diagnosis not present

## 2016-06-09 DIAGNOSIS — Z79899 Other long term (current) drug therapy: Secondary | ICD-10-CM | POA: Diagnosis not present

## 2016-06-09 MED ORDER — NITROGLYCERIN 0.4 MG SL SUBL: SUBLINGUAL_TABLET | SUBLINGUAL | 11 refills | Status: DC

## 2016-06-09 NOTE — Patient Instructions (Signed)
Medication Instructions:  The current medical regimen is effective;  continue present plan and medications.  Labwork: Please have blood work today (Lipid/Liver).  Follow-Up: Follow up in 1 year with Dr. Antoine PocheHochrein.  You will receive a letter in the mail 2 months before you are due.  Please call us when you receive this letter to schedule your follow up appointment.  If you need a refill on your cardiac medications before your next appointment, please call your pharmacy.  Thank you for choosing [facility]!!

## 2016-06-10 ENCOUNTER — Other Ambulatory Visit: Payer: Medicare Other

## 2016-06-10 DIAGNOSIS — E785 Hyperlipidemia, unspecified: Secondary | ICD-10-CM

## 2016-06-10 DIAGNOSIS — Z79899 Other long term (current) drug therapy: Secondary | ICD-10-CM

## 2016-06-11 LAB — LIPID PANEL
CHOL/HDL RATIO: 2.6 ratio (ref 0.0–5.0)
Cholesterol, Total: 99 mg/dL — ABNORMAL LOW (ref 100–199)
HDL: 38 mg/dL — AB (ref >39)
LDL Calculated: 50 mg/dL (ref 0–99)
TRIGLYCERIDES: 57 mg/dL (ref 0–149)
VLDL CHOLESTEROL CAL: 11 mg/dL (ref 5–40)

## 2016-06-11 LAB — HEPATIC FUNCTION PANEL
ALBUMIN: 4.1 g/dL (ref 3.5–4.8)
ALT: 16 IU/L (ref 0–44)
AST: 19 IU/L (ref 0–40)
Alkaline Phosphatase: 100 IU/L (ref 39–117)
Bilirubin Total: 0.5 mg/dL (ref 0.0–1.2)
Bilirubin, Direct: 0.14 mg/dL (ref 0.00–0.40)
Total Protein: 6.6 g/dL (ref 6.0–8.5)

## 2020-01-30 ENCOUNTER — Other Ambulatory Visit: Payer: Self-pay

## 2020-01-30 ENCOUNTER — Emergency Department (HOSPITAL_COMMUNITY): Payer: Medicare PPO

## 2020-01-30 ENCOUNTER — Inpatient Hospital Stay (HOSPITAL_COMMUNITY)
Admission: EM | Admit: 2020-01-30 | Discharge: 2020-02-26 | DRG: 951 | Disposition: E | Payer: Medicare PPO | Attending: Internal Medicine | Admitting: Internal Medicine

## 2020-01-30 ENCOUNTER — Encounter (HOSPITAL_COMMUNITY): Payer: Self-pay | Admitting: Emergency Medicine

## 2020-01-30 DIAGNOSIS — I959 Hypotension, unspecified: Secondary | ICD-10-CM

## 2020-01-30 DIAGNOSIS — Z955 Presence of coronary angioplasty implant and graft: Secondary | ICD-10-CM | POA: Diagnosis not present

## 2020-01-30 DIAGNOSIS — Z20822 Contact with and (suspected) exposure to covid-19: Secondary | ICD-10-CM | POA: Diagnosis present

## 2020-01-30 DIAGNOSIS — E872 Acidosis, unspecified: Secondary | ICD-10-CM

## 2020-01-30 DIAGNOSIS — J9601 Acute respiratory failure with hypoxia: Secondary | ICD-10-CM | POA: Diagnosis present

## 2020-01-30 DIAGNOSIS — I252 Old myocardial infarction: Secondary | ICD-10-CM

## 2020-01-30 DIAGNOSIS — R7881 Bacteremia: Secondary | ICD-10-CM

## 2020-01-30 DIAGNOSIS — F17290 Nicotine dependence, other tobacco product, uncomplicated: Secondary | ICD-10-CM | POA: Diagnosis present

## 2020-01-30 DIAGNOSIS — Z515 Encounter for palliative care: Secondary | ICD-10-CM | POA: Diagnosis present

## 2020-01-30 DIAGNOSIS — Z9114 Patient's other noncompliance with medication regimen: Secondary | ICD-10-CM | POA: Diagnosis not present

## 2020-01-30 DIAGNOSIS — Z66 Do not resuscitate: Secondary | ICD-10-CM | POA: Diagnosis not present

## 2020-01-30 DIAGNOSIS — R0602 Shortness of breath: Secondary | ICD-10-CM | POA: Diagnosis present

## 2020-01-30 DIAGNOSIS — A4159 Other Gram-negative sepsis: Secondary | ICD-10-CM | POA: Diagnosis present

## 2020-01-30 DIAGNOSIS — J189 Pneumonia, unspecified organism: Secondary | ICD-10-CM | POA: Diagnosis present

## 2020-01-30 DIAGNOSIS — J441 Chronic obstructive pulmonary disease with (acute) exacerbation: Secondary | ICD-10-CM | POA: Diagnosis present

## 2020-01-30 DIAGNOSIS — J440 Chronic obstructive pulmonary disease with acute lower respiratory infection: Secondary | ICD-10-CM | POA: Diagnosis present

## 2020-01-30 DIAGNOSIS — I9581 Postprocedural hypotension: Secondary | ICD-10-CM | POA: Diagnosis not present

## 2020-01-30 DIAGNOSIS — I469 Cardiac arrest, cause unspecified: Secondary | ICD-10-CM

## 2020-01-30 DIAGNOSIS — E785 Hyperlipidemia, unspecified: Secondary | ICD-10-CM | POA: Diagnosis present

## 2020-01-30 DIAGNOSIS — Z886 Allergy status to analgesic agent status: Secondary | ICD-10-CM | POA: Diagnosis not present

## 2020-01-30 DIAGNOSIS — Z823 Family history of stroke: Secondary | ICD-10-CM | POA: Diagnosis not present

## 2020-01-30 DIAGNOSIS — I2510 Atherosclerotic heart disease of native coronary artery without angina pectoris: Secondary | ICD-10-CM | POA: Diagnosis present

## 2020-01-30 DIAGNOSIS — A4150 Gram-negative sepsis, unspecified: Secondary | ICD-10-CM

## 2020-01-30 LAB — COMPREHENSIVE METABOLIC PANEL
ALT: 27 U/L (ref 0–44)
AST: 46 U/L — ABNORMAL HIGH (ref 15–41)
Albumin: 3.1 g/dL — ABNORMAL LOW (ref 3.5–5.0)
Alkaline Phosphatase: 67 U/L (ref 38–126)
Anion gap: 13 (ref 5–15)
BUN: 30 mg/dL — ABNORMAL HIGH (ref 8–23)
CO2: 26 mmol/L (ref 22–32)
Calcium: 8.1 mg/dL — ABNORMAL LOW (ref 8.9–10.3)
Chloride: 102 mmol/L (ref 98–111)
Creatinine, Ser: 1.13 mg/dL (ref 0.61–1.24)
GFR calc Af Amer: >60 mL/min (ref >60)
GFR calc non Af Amer: >60 mL/min (ref >60)
Glucose, Bld: 143 mg/dL — ABNORMAL HIGH (ref 70–99)
Potassium: 3.9 mmol/L (ref 3.5–5.1)
Sodium: 141 mmol/L (ref 135–145)
Total Bilirubin: 1.4 mg/dL — ABNORMAL HIGH (ref 0.3–1.2)
Total Protein: 6.1 g/dL — ABNORMAL LOW (ref 6.5–8.1)

## 2020-01-30 LAB — CBC WITH DIFFERENTIAL/PLATELET
Abs Immature Granulocytes: 0.06 10*3/uL (ref 0.00–0.07)
Basophils Absolute: 0 10*3/uL (ref 0.0–0.1)
Basophils Relative: 0 %
Eosinophils Absolute: 0 10*3/uL (ref 0.0–0.5)
Eosinophils Relative: 0 %
HCT: 54 % — ABNORMAL HIGH (ref 39.0–52.0)
Hemoglobin: 16.5 g/dL (ref 13.0–17.0)
Immature Granulocytes: 1 %
Lymphocytes Relative: 7 %
Lymphs Abs: 0.8 10*3/uL (ref 0.7–4.0)
MCH: 32.9 pg (ref 26.0–34.0)
MCHC: 30.6 g/dL (ref 30.0–36.0)
MCV: 107.6 fL — ABNORMAL HIGH (ref 80.0–100.0)
Monocytes Absolute: 1 10*3/uL (ref 0.1–1.0)
Monocytes Relative: 9 %
Neutro Abs: 10.2 10*3/uL — ABNORMAL HIGH (ref 1.7–7.7)
Neutrophils Relative %: 83 %
Platelets: 204 10*3/uL (ref 150–400)
RBC: 5.02 MIL/uL (ref 4.22–5.81)
RDW: 13.2 % (ref 11.5–15.5)
WBC: 12.2 10*3/uL — ABNORMAL HIGH (ref 4.0–10.5)
nRBC: 0.2 % (ref 0.0–0.2)

## 2020-01-30 LAB — BLOOD GAS, ARTERIAL
Acid-base deficit: 0.9 mmol/L (ref 0.0–2.0)
Bicarbonate: 23.3 mmol/L (ref 20.0–28.0)
FIO2: 100
O2 Saturation: 99.3 %
Patient temperature: 37.5
pCO2 arterial: 44 mmHg (ref 32.0–48.0)
pH, Arterial: 7.353 (ref 7.350–7.450)
pO2, Arterial: 383 mmHg — ABNORMAL HIGH (ref 83.0–108.0)

## 2020-01-30 LAB — TRIGLYCERIDES: Triglycerides: 68 mg/dL (ref <150)

## 2020-01-30 LAB — I-STAT CHEM 8, ED
BUN: 35 mg/dL — ABNORMAL HIGH (ref 8–23)
Calcium, Ion: 1.1 mmol/L — ABNORMAL LOW (ref 1.15–1.40)
Chloride: 102 mmol/L (ref 98–111)
Creatinine, Ser: 1.1 mg/dL (ref 0.61–1.24)
Glucose, Bld: 139 mg/dL — ABNORMAL HIGH (ref 70–99)
HCT: 53 % — ABNORMAL HIGH (ref 39.0–52.0)
Hemoglobin: 18 g/dL — ABNORMAL HIGH (ref 13.0–17.0)
Potassium: 3.9 mmol/L (ref 3.5–5.1)
Sodium: 140 mmol/L (ref 135–145)
TCO2: 30 mmol/L (ref 22–32)

## 2020-01-30 LAB — RESPIRATORY PANEL BY RT PCR (FLU A&B, COVID)
Influenza A by PCR: NEGATIVE
Influenza B by PCR: NEGATIVE
SARS Coronavirus 2 by RT PCR: NEGATIVE

## 2020-01-30 LAB — POC SARS CORONAVIRUS 2 AG -  ED: SARS Coronavirus 2 Ag: NEGATIVE

## 2020-01-30 LAB — MRSA PCR SCREENING: MRSA by PCR: NEGATIVE

## 2020-01-30 LAB — CBG MONITORING, ED: Glucose-Capillary: 119 mg/dL — ABNORMAL HIGH (ref 70–99)

## 2020-01-30 LAB — LACTIC ACID, PLASMA: Lactic Acid, Venous: 6.4 mmol/L (ref 0.5–1.9)

## 2020-01-30 LAB — TROPONIN I (HIGH SENSITIVITY): Troponin I (High Sensitivity): 61 ng/L — ABNORMAL HIGH (ref <18)

## 2020-01-30 LAB — BRAIN NATRIURETIC PEPTIDE: B Natriuretic Peptide: 1958 pg/mL — ABNORMAL HIGH (ref 0.0–100.0)

## 2020-01-30 MED ORDER — SODIUM CHLORIDE 0.9 % IV BOLUS
1000.0000 mL | Freq: Once | INTRAVENOUS | Status: AC
  Administered 2020-01-30: 1000 mL via INTRAVENOUS

## 2020-01-30 MED ORDER — ACETAMINOPHEN 650 MG RE SUPP: 650.0000 mg | Freq: Four times a day (QID) | RECTAL | Status: DC | PRN

## 2020-01-30 MED ORDER — PHENYLEPHRINE 200 MCG/ML FOR PRIAPISM / HYPOTENSION
INTRAMUSCULAR | Status: AC
  Filled 2020-01-30: qty 50

## 2020-01-30 MED ORDER — ROCURONIUM BROMIDE 50 MG/5ML IV SOLN
70.0000 mg | Freq: Once | INTRAVENOUS | Status: AC
  Administered 2020-01-30: 70 mg via INTRAVENOUS

## 2020-01-30 MED ORDER — GLYCOPYRROLATE 0.2 MG/ML IJ SOLN: 0.2000 mg | INTRAMUSCULAR | Status: DC | PRN

## 2020-01-30 MED ORDER — PHENYLEPHRINE 40 MCG/ML (10ML) SYRINGE FOR IV PUSH (FOR BLOOD PRESSURE SUPPORT)
120.0000 ug | PREFILLED_SYRINGE | Freq: Once | INTRAVENOUS | Status: AC | PRN
  Administered 2020-01-30: 100 ug via INTRAVENOUS

## 2020-01-30 MED ORDER — SODIUM CHLORIDE 0.9 % IV SOLN
1.0000 g | Freq: Once | INTRAVENOUS | Status: AC
  Administered 2020-01-30: 1 g via INTRAVENOUS
  Filled 2020-01-30: qty 10

## 2020-01-30 MED ORDER — FENTANYL CITRATE (PF) 100 MCG/2ML IJ SOLN: 50.0000 ug | INTRAMUSCULAR | Status: DC | PRN

## 2020-01-30 MED ORDER — ORAL CARE MOUTH RINSE: 15.0000 mL | Freq: Two times a day (BID) | OROMUCOSAL | Status: DC

## 2020-01-30 MED ORDER — ETOMIDATE 2 MG/ML IV SOLN
20.0000 mg | Freq: Once | INTRAVENOUS | Status: AC
  Administered 2020-01-30: 13:00:00 20 mg via INTRAVENOUS

## 2020-01-30 MED ORDER — ACETAMINOPHEN 325 MG PO TABS: 650.0000 mg | ORAL_TABLET | Freq: Four times a day (QID) | ORAL | Status: DC | PRN

## 2020-01-30 MED ORDER — MIDAZOLAM HCL 2 MG/2ML IJ SOLN: 1.0000 mg | INTRAMUSCULAR | Status: DC | PRN

## 2020-01-30 MED ORDER — EPINEPHRINE 1 MG/10ML IJ SOSY
PREFILLED_SYRINGE | INTRAMUSCULAR | Status: AC | PRN
  Administered 2020-01-30: 1 mg via INTRAVENOUS

## 2020-01-30 MED ORDER — GLYCOPYRROLATE 1 MG PO TABS: 1.0000 mg | ORAL_TABLET | ORAL | Status: DC | PRN

## 2020-01-30 MED ORDER — LORAZEPAM 2 MG/ML PO CONC: 1.0000 mg | ORAL | Status: DC | PRN

## 2020-01-30 MED ORDER — GLYCOPYRROLATE 0.2 MG/ML IJ SOLN
0.2000 mg | INTRAMUSCULAR | Status: DC | PRN
  Administered 2020-01-30: 0.2 mg via INTRAVENOUS
  Filled 2020-01-30: qty 1

## 2020-01-30 MED ORDER — CHLORHEXIDINE GLUCONATE 0.12 % MT SOLN
15.0000 mL | Freq: Two times a day (BID) | OROMUCOSAL | Status: DC
  Administered 2020-01-30: 15 mL via OROMUCOSAL
  Filled 2020-01-30: qty 15

## 2020-01-30 MED ORDER — LACTATED RINGERS IV BOLUS
1000.0000 mL | Freq: Once | INTRAVENOUS | Status: AC
  Administered 2020-01-30: 1000 mL via INTRAVENOUS

## 2020-01-30 MED ORDER — LORAZEPAM 1 MG PO TABS: 1.0000 mg | ORAL_TABLET | ORAL | Status: DC | PRN

## 2020-01-30 MED ORDER — MORPHINE SULFATE (PF) 2 MG/ML IV SOLN
1.0000 mg | INTRAVENOUS | Status: DC | PRN
  Administered 2020-01-30: 1 mg via INTRAVENOUS
  Filled 2020-01-30: qty 1

## 2020-01-30 MED ORDER — LORAZEPAM 2 MG/ML IJ SOLN
1.0000 mg | INTRAMUSCULAR | Status: DC | PRN
  Administered 2020-01-30: 1 mg via INTRAVENOUS
  Filled 2020-01-30: qty 1

## 2020-01-30 MED ORDER — SODIUM CHLORIDE 0.9 % IV SOLN
500.0000 mg | Freq: Once | INTRAVENOUS | Status: AC
  Administered 2020-01-30: 500 mg via INTRAVENOUS
  Filled 2020-01-30: qty 500

## 2020-01-30 MED ORDER — PROPOFOL 1000 MG/100ML IV EMUL: 0.0000 ug/kg/min | INTRAVENOUS | Status: DC

## 2020-01-30 NOTE — Progress Notes (Signed)
I was contacted by EDP with pt adequately revived after cpr but as noted above despite fluid challenge his bp is again in 60s with ice cold ext and fm wanting to shift to comfort measures.  He appears comfortable on present setting though there is consderable air trapping   Therefore changed vent to high Vt, lower rate and available at Dr Don Perking request should circumstances change beyond comfort measures.   Sandrea Hughs, MD Pulmonary and Critical Care Medicine [facility] Cell [phone] After 6:00 PM or weekends, use Beeper [phone]  After 7:00 pm call Elink  [phone]

## 2020-01-30 NOTE — ED Notes (Signed)
Date and time results received: 2021/08/131350 (use smartphrase ".now" to insert current time)  Test: Lactic Acid Critical Value: 6.4  Name of Provider Notified: Dr Rubin Payor Orders Received? Or Actions Taken?: NA

## 2020-01-30 NOTE — Progress Notes (Signed)
RT note-Patient transported to ICU, ventilator settings changed per Dr.Wert, continue to monitor.

## 2020-01-30 NOTE — ED Notes (Signed)
Family at bedside with chaplain  

## 2020-01-30 NOTE — Progress Notes (Signed)
Present with Carl Barrera and his family for emotional and spiritual support. Carl Barrera his wife shared about who he was and the changes in his health over the past 2 years. She has been very supportive of him and as we discussed what he might want should his health decline, she was unsure. Supportive listening. After many times of checking with them, reassurance about decision of comfort and no CPR for him remained important for Carl Barrera. He became more meaningful in ability to communicate during the afternoon.   Prayed with them for decisions, care and peace about what lies ahead.

## 2020-01-30 NOTE — ED Triage Notes (Signed)
Pt from home.  EMS arrived, Pt's o2 65% on room air and weakness.  C/o of sob x 1 month. Solumedrol via EMS

## 2020-01-30 NOTE — H&P (Signed)
History and Physical  [name] GOT:[id number] DOB: Jun 16, 1946 DOA: 2020-02-04   PCP: Carl Spencer, FNP   Patient coming from: Home  Chief Complaint: sob  HPI:  Carl Barrera is a 74 y.o. male with medical history of COPD, hyperlipidemia, coronary artery disease presenting with shortness of breath for the last 3 days.  In actuality, the patient has been complaining shortness of breath for over a month with worsening over the last 3 days.  He continues to smoke 1 pack/day.  He has an over 50-pack-year history of tobacco.  The patient is unable to provide any history at this time secondary to extremis and being intubated.  Apparently, EMS was activated because of shortness of breath.  The patient was found to have oxygen saturation of 65% room air.  The patient was given Solu-Medrol by EMS.  Notably, the patient had not seen a physician in 3 to 4 years.  Furthermore, the patient quit taking all of his prescription medications 3 to 4 years ago.  In the emergency department, the patient was initially placed on nonrebreather.  He became increasingly somnolent.  There was concern about the patient's ability to protect his airway.  As result, the patient was intubated in the emergency department.  After intubation, the patient became hypotensive.  Subsequently developed PEA.  ACLS protocol was instituted and CPR was started.  The patient was given epinephrine and also a phenylephrine push.  He had return of spontaneous circulation. EDP spoke with critical care who felt the patient was okay to stay at [facility]. As I came to evaluate the patient, the patient became increasingly hypotensive once again.  I ordered another liter of fluid bolus, but the patient continued to remain hypotensive. I had an extensive goals of care discussion and advance care planning discussion with the patient's spouse at the bedside.  I discussed the patient's critical condition and overall poor prognosis given his  severe COPD and other comorbidities and advanced age. Chaplain Leola Brazil was present during the discussion.  Therapeutic listening was instituted.  After further discussion, the patient's wife felt there was in the patient's best interest to transition his care to focus solely on his comfort.  She did not want any further invasive procedures.  Assessment/Plan: Acute respiratory failure with hypoxia -Secondary to COPD exacerbation although cannot rule out underlying infectious process/pneumonia -Normally not on oxygen at home -Currently intubated on mechanical ventilation  COPD exacerbation -Initially planned for bronchodilators and Solu-Medrol -Patient's focus of care has been transitioned to focus on solely his comfort  Cardiopulmonary arrest -Patient underwent CPR with epinephrine given -Occurred after the patient became hypotensive after intubation -Likely resultant from the patient's severe hypoxia and respiratory failure  Lactic acidosis -Do not feel this is completely attributable to sepsis -The patient's hypoxia has contributed to his elevated lactic acid -No longer active issue as the patient's focus of care has been transitioned to focus only on his comfort -Patient spouse does not want any further blood work -IV antibiotics and blood cultures were obtained prior to both of care discussion after which the patient's spouse decided to transition the patient's focus of care to full comfort  Coronary artery disease -Patient quit taking his Plavix and Imdur over 3 years ago -No longer active issue as the patient's focus of care has been transitioned to focus only on his comfort  Hyperlipidemia -Patient quit taking his Plavix and Imdur over 3 years ago -No longer active  issue as the patient's focus of care has been transitioned to focus only on his comfort  Tobacco abuse -Patient had continued to smoke  Goals of care discussion Advance care planning, including the  explanation and discussion of advance directives was carried out with the patient and family.  Code status including explanations of "Full Code" and "DNR" and alternatives were discussed in detail.  Discussion of end-of-life issues including but not limited palliative care, hospice care and the concept of hospice, other end-of-life care options, power of attorney for health care decisions, living wills, and physician orders for life-sustaining treatment were also discussed with the patient and family.  Total face to face time 16 minutes. -After discussion with the patient's spouse, she felt that it was in the patient's best interest to transition his care to focus solely on his comfort   The patient is critically ill with multiple organ systems failure and requires high complexity decision making for assessment and support, frequent evaluation and titration of therapies, application of advanced monitoring technologies and extensive interpretation of multiple databases.  Critical care time - 55 mins.    Past Medical History:  Diagnosis Date  . Asthma   . Cardiovascular disease    MI PTCA of a circumflex lesion in 1994. Last catheterization 2005 with stenting of the PDA stent. At that time he had 70% LAD stenosis managed medically.  . Hyperlipidemia   . Tobacco abuse    Past Surgical History:  Procedure Laterality Date  . None     Social History:  reports that he has been smoking cigars. He has a 53.00 pack-year smoking history. He has never used smokeless tobacco. He reports that he does not drink alcohol or use drugs.   Family History  Problem Relation Age of Onset  . Stroke Mother      Allergies  Allergen Reactions  . Aspirin     Rash     Prior to Admission medications   Medication Sig Start Date End Date Taking? Authorizing Provider  clopidogrel (PLAVIX) 75 MG tablet TAKE 1 TABLET (75 MG TOTAL) BY MOUTH DAILY. 11/24/15   Rollene Rotunda, MD  cyanocobalamin ([employer] VITAMIN B12)  1000 MCG tablet Take 100 mcg by mouth daily.    [provider]  isosorbide mononitrate (IMDUR) 30 MG 24 hr tablet TAKE 1 TABLET (30 MG TOTAL) BY MOUTH DAILY. 11/24/15   Rollene Rotunda, MD  nitroGLYCERIN (NITROSTAT) 0.4 MG SL tablet USE AS DIRECTED EVERY 5 MINS. UP TO 3 X AS NEEDED 06/09/16   Rollene Rotunda, MD  simvastatin (ZOCOR) 80 MG tablet TAKE 1 TABLET (80 MG TOTAL) BY MOUTH AT BEDTIME. 11/24/15   Rollene Rotunda, MD    Review of Systems:  Unobtainable secondary to patient's intubation Physical Exam: Vitals:   02/03/20 1400 Feb 03, 2020 1405 02-03-2020 1410 02-03-20 1415  BP: (!) 138/95 (!) 130/91 (!) 141/98 (!) 131/95  Pulse: 79 85 92 93  Resp: 16 17 17 19   Temp: 99.5 F (37.5 C) 99.5 F (37.5 C) 99.3 F (37.4 C) 99.1 F (37.3 C)  TempSrc:      SpO2: 100% 100% 100% 100%  Weight:      Height:       General:  NAD,  intubated on mechanical ventilation  Head/Eye: No conjunctival hemorrhage, no icterus, [place]/AT, No nystagmus ENT:  No icterus,  No thrush,, no pharyngeal exudate Neck:  No masses, no lymphadenpathy,  CV:  RRR, no rub, no gallop, no S3 Lung: Diminished breath sounds bilateral.  Bibasilar rales. Abdomen: soft/NT, +BS, nondistended, no peritoneal signs Ext: No cyanosis, No rashes, No petechiae, No lymphangitis, 1 + LE edema   Labs on Admission:  Basic Metabolic Panel: Recent Labs  Lab 2020/02/17 1313 2020/02/17 1327  NA 141 140  K 3.9 3.9  CL 102 102  CO2 26  --   GLUCOSE 143* 139*  BUN 30* 35*  CREATININE 1.13 1.10  CALCIUM 8.1*  --    Liver Function Tests: Recent Labs  Lab 2020/02/17 1313  AST 46*  ALT 27  ALKPHOS 67  BILITOT 1.4*  PROT 6.1*  ALBUMIN 3.1*   No results for input(s): LIPASE, AMYLASE in the last 168 hours. No results for input(s): AMMONIA in the last 168 hours. CBC: Recent Labs  Lab 17-Feb-2020 1313 Feb 17, 2020 1327  WBC 12.2*  --   NEUTROABS 10.2*  --   HGB 16.5 18.0*  HCT 54.0* 53.0*  MCV 107.6*  --   PLT 204  --     Coagulation Profile: No results for input(s): INR, PROTIME in the last 168 hours. Cardiac Enzymes: No results for input(s): CKTOTAL, CKMB, CKMBINDEX, TROPONINI in the last 168 hours. BNP: Invalid input(s): POCBNP CBG: Recent Labs  Lab 2020/02/17 1305  GLUCAP 119*   Urine analysis:    Component Value Date/Time   COLORURINE YELLOW 12/07/2015 1444   APPEARANCEUR CLEAR 12/07/2015 1444   LABSPEC 1.019 12/07/2015 1444   PHURINE 5.0 12/07/2015 1444   GLUCOSEU NEGATIVE 12/07/2015 1444   HGBUR NEGATIVE 12/07/2015 1444   BILIRUBINUR NEGATIVE 12/07/2015 1444   KETONESUR NEGATIVE 12/07/2015 1444   PROTEINUR NEGATIVE 12/07/2015 1444   NITRITE NEGATIVE 12/07/2015 1444   LEUKOCYTESUR SMALL (A) 12/07/2015 1444   Sepsis Labs: @LABRCNTIP (procalcitonin:4,lacticidven:4) ) Recent Results (from the past 240 hour(s))  Culture, blood (routine x 2)     Status: None (Preliminary result)   Collection Time: 02-17-20  1:14 PM   Specimen: Blood  Result Value Ref Range Status   Specimen Description BLOOD LEFT ARM  Final   Special Requests   Final    BOTTLES DRAWN AEROBIC AND ANAEROBIC Blood Culture adequate volume Performed at [facility], [address] [place], [place] [address]    Culture PENDING  Incomplete   Report Status PENDING  Incomplete  Culture, blood (routine x 2)     Status: None (Preliminary result)   Collection Time: 02-17-2020  1:30 PM   Specimen: Blood  Result Value Ref Range Status   Specimen Description BLOOD RIGHT ARM  Final   Special Requests   Final    BOTTLES DRAWN AEROBIC AND ANAEROBIC Blood Culture adequate volume Performed at [facility], [address] [place], [place] [address]    Culture PENDING  Incomplete   Report Status PENDING  Incomplete     Radiological Exams on Admission: DG Chest Portable 1 View  Result Date: 2020-02-17 CLINICAL DATA:  Shortness of breath ETT placement EXAM: PORTABLE CHEST 1 VIEW COMPARISON:  12/07/2015 FINDINGS: Rotated examination.  Severe, bullous emphysema. Endotracheal tube is positioned with tip near the thoracic inlet. The carina is difficult to directly visualize due to rotation, although endotracheal tube tip is approximately 9 cm above the carina. Esophagogastric tube with tip below the diaphragm, side port at the level of the gastroesophageal junction. The visualized skeletal structures are unremarkable. IMPRESSION: 1. Rotated examination with severe, bullous emphysema. No acute appearing airspace opacity. 2. Endotracheal tube is positioned with tip near the thoracic inlet. The carina is difficult to directly visualize due to rotation,  although endotracheal tube tip is approximately 9 cm above the carina. 3. Esophagogastric tube with tip below the diaphragm, side port at the level of the gastroesophageal junction. Electronically Signed   By: Lauralyn Primes M.D.   On: 02/03/2020 13:26    EKG: Independently reviewed. Sinus, nonspecific T wave changes    Time spent:80 minutes Code Status:   COMFORT CARE Family Communication:  Spouse updated at bedside Disposition Plan: in-hospital death Consults called: PCCM DVT Prophylaxis: COMFORT CARE  Catarina Hartshorn, DO  [facility] Pager [phone]  If 7PM-7AM, please contact night-coverage [email or web address] Password [facility] 02/15/2020, 3:18 PM

## 2020-01-30 NOTE — Code Documentation (Signed)
cbg 119 

## 2020-01-30 NOTE — ED Notes (Signed)
+   color change noted. Pt placed immediately on vent.

## 2020-01-30 NOTE — Progress Notes (Signed)
Came back to see patient and talk to spouse and son Patient is more alert.  He is trying to talk through the endotracheal tube.  Denies cp, sob He wants to be extubated.   I had another extensive goals of care discussion with the patient spouse and son. -They have both made it clear they do not want the patient to suffer -They have also stated that they do not want to have any further invasive procedures including central line or any more venipunctures for blood work or x-rays -They wanted to continue to focus on the patient's comfort and stated that the patient wants to drink Pepsi-Cola  -Comfort care orders have been placed -Compassionate extubation will be performed DTat

## 2020-01-30 NOTE — ED Notes (Signed)
Breath sounds noted

## 2020-01-30 NOTE — Progress Notes (Signed)
Patient was extubated without complications. Mouth care and suction provided. Patient wanted something to drink. Voice is currently hoarse. Took sips of water without difficulty or cough. Family at bedside. Emotional support provided. PRN Morphine given per patient request and for dyspnea.Patient has stated good relief since getting PRN.

## 2020-01-30 NOTE — Procedures (Signed)
Extubation Procedure Note  Patient Details:   Name: Carl Barrera DOB: May 29, 1946 MRN: [id number]   Airway Documentation:    Vent end date: 02/09/20 Vent end time: 1836   Evaluation  O2 sats: stable throughout Complications: No apparent complications Patient did tolerate procedure well. Bilateral Breath Sounds: Rhonchi, Diminished   Yes  Placed on 3l/min [place], comfort orders  [name] 2020-02-09, 6:36 PM

## 2020-01-30 NOTE — Code Documentation (Signed)
Pt lost pulses while xray being performed, PEA rhythm, Dr Rubin Payor at bedside, cpr started,

## 2020-01-30 NOTE — ED Notes (Signed)
Pt intubated

## 2020-01-30 NOTE — ED Provider Notes (Signed)
[facility] Provider Note   CSN: [id number] Arrival date & time: 02-29-2020  1231     History Chief Complaint  Patient presents with  . Shortness of Breath    Carl Barrera is a 74 y.o. male.  HPI Level 5 caveat due to altered mental status.  Reportedly short of breath for the last several days.  Multiple bleeding at home.  However patient has not seen a doctor in years and has been off his medicines for at least 2 years now.  Sats in the 60s by EMS.  Patient reportedly speaking very little because he has to concentrate so hard on breathing.  Reportedly has not eaten in around 3 days.  CBG good for EMS.  Sats had improved for EMS on nonrebreather.    Past Medical History:  Diagnosis Date  . Asthma   . Cardiovascular disease    MI PTCA of a circumflex lesion in 1994. Last catheterization 2005 with stenting of the PDA stent. At that time he had 70% LAD stenosis managed medically.  . Hyperlipidemia   . Tobacco abuse     Patient Active Problem List   Diagnosis Date Noted  . CAD (coronary artery disease)   . Hyperlipidemia   . Tobacco abuse     Past Surgical History:  Procedure Laterality Date  . None         Family History  Problem Relation Age of Onset  . Stroke Mother     Social History   Tobacco Use  . Smoking status: Current Every Day Smoker    Packs/day: 1.00    Years: 53.00    Pack years: 53.00    Types: Cigars  . Smokeless tobacco: Never Used  . Tobacco comment: He cannot quit.  He smokes small cigars.  Substance Use Topics  . Alcohol use: No  . Drug use: No    Home Medications Prior to Admission medications   Medication Sig Start Date End Date Taking? Authorizing Provider  clopidogrel (PLAVIX) 75 MG tablet TAKE 1 TABLET (75 MG TOTAL) BY MOUTH DAILY. 11/24/15   Minus Breeding, MD  cyanocobalamin ([employer] VITAMIN B12) 1000 MCG tablet Take 100 mcg by mouth daily.    [provider]  isosorbide mononitrate (IMDUR) 30 MG 24 hr  tablet TAKE 1 TABLET (30 MG TOTAL) BY MOUTH DAILY. 11/24/15   Minus Breeding, MD  nitroGLYCERIN (NITROSTAT) 0.4 MG SL tablet USE AS DIRECTED EVERY 5 MINS. UP TO 3 X AS NEEDED 06/09/16   Minus Breeding, MD  simvastatin (ZOCOR) 80 MG tablet TAKE 1 TABLET (80 MG TOTAL) BY MOUTH AT BEDTIME. 11/24/15   Minus Breeding, MD    Allergies    Aspirin  Review of Systems   Review of Systems  Unable to perform ROS: Mental status change    Physical Exam Updated Vital Signs BP (!) 167/107   Pulse 80   Temp 99.7 F (37.6 C)   Resp 16   Ht 5\' 11"  (1.803 m)   Wt 47.7 kg   SpO2 100%   BMI 14.67 kg/m   Physical Exam Vitals and nursing note reviewed.  Constitutional:      Appearance: He is well-developed.  HENT:     Head: Normocephalic.  Neck:     Vascular: No JVD.  Cardiovascular:     Rate and Rhythm: Tachycardia present.  Pulmonary:     Comments: Diffuse harsh breath sounds with some rales and wheezes. Chest:     Chest wall: No  tenderness.  Abdominal:     Tenderness: There is no abdominal tenderness.  Musculoskeletal:     Cervical back: Neck supple.     Right lower leg: No edema.     Left lower leg: No edema.  Skin:    General: Skin is warm.     Capillary Refill: Capillary refill takes less than 2 seconds.  Neurological:     Comments: Has gag reflex but otherwise unresponsive.  Will look to voice at times but no response to IV lines.     ED Results / Procedures / Treatments   Labs (all labs ordered are listed, but only abnormal results are displayed) Labs Reviewed  LACTIC ACID, PLASMA - Abnormal; Notable for the following components:      Result Value   Lactic Acid, Venous 6.4 (*)    All other components within normal limits  CBC WITH DIFFERENTIAL/PLATELET - Abnormal; Notable for the following components:   WBC 12.2 (*)    HCT 54.0 (*)    MCV 107.6 (*)    Neutro Abs 10.2 (*)    All other components within normal limits  CBG MONITORING, ED - Abnormal; Notable for the  following components:   Glucose-Capillary 119 (*)    All other components within normal limits  I-STAT CHEM 8, ED - Abnormal; Notable for the following components:   BUN 35 (*)    Glucose, Bld 139 (*)    Calcium, Ion 1.10 (*)    Hemoglobin 18.0 (*)    HCT 53.0 (*)    All other components within normal limits  CULTURE, BLOOD (ROUTINE X 2)  CULTURE, BLOOD (ROUTINE X 2)  TRIGLYCERIDES  COMPREHENSIVE METABOLIC PANEL  LACTIC ACID, PLASMA  BRAIN NATRIURETIC PEPTIDE  BLOOD GAS, ARTERIAL  POC SARS CORONAVIRUS 2 AG -  ED  TROPONIN I (HIGH SENSITIVITY)    EKG EKG Interpretation  Date/Time:  Wednesday 2020/02/20 12:37:59 EDT Ventricular Rate:  150 PR Interval:    QRS Duration: 76 QT Interval:  284 QTC Calculation: 418 R Axis:   108 Text Interpretation: Sinus tachycardia Paired ventricular premature complexes Right axis deviation Anteroseptal infarct, old Confirmed by Benjiman Core [phone]) on 2020-02-20 12:53:22 PM   Radiology DG Chest Portable 1 View  Result Date: 02-20-2020 CLINICAL DATA:  Shortness of breath ETT placement EXAM: PORTABLE CHEST 1 VIEW COMPARISON:  12/07/2015 FINDINGS: Rotated examination. Severe, bullous emphysema. Endotracheal tube is positioned with tip near the thoracic inlet. The carina is difficult to directly visualize due to rotation, although endotracheal tube tip is approximately 9 cm above the carina. Esophagogastric tube with tip below the diaphragm, side port at the level of the gastroesophageal junction. The visualized skeletal structures are unremarkable. IMPRESSION: 1. Rotated examination with severe, bullous emphysema. No acute appearing airspace opacity. 2. Endotracheal tube is positioned with tip near the thoracic inlet. The carina is difficult to directly visualize due to rotation, although endotracheal tube tip is approximately 9 cm above the carina. 3. Esophagogastric tube with tip below the diaphragm, side port at the level of the gastroesophageal  junction. Electronically Signed   By: Lauralyn Primes M.D.   On: 02-20-2020 13:26    Procedures Procedures (including critical care time)  Medications Ordered in ED Medications  fentaNYL (SUBLIMAZE) injection 50 mcg (has no administration in time range)  fentaNYL (SUBLIMAZE) injection 50 mcg (has no administration in time range)  propofol (DIPRIVAN) 1000 MG/100ML infusion (has no administration in time range)  midazolam (VERSED) injection 1 mg (has no  administration in time range)  midazolam (VERSED) injection 1 mg (has no administration in time range)  PHENYLEPHRINE 200 MCG/ML FOR PRIAPISM / HYPOTENSION 200 mcg/ml injection SOLN (has no administration in time range)  cefTRIAXone (ROCEPHIN) 1 g in sodium chloride 0.9 % 100 mL IVPB (has no administration in time range)  azithromycin (ZITHROMAX) 500 mg in sodium chloride 0.9 % 250 mL IVPB (has no administration in time range)  etomidate (AMIDATE) injection 20 mg (20 mg Intravenous Given 02/18/2020 1245)  rocuronium (ZEMURON) injection 70 mg (70 mg Intravenous Given 02/25/2020 1246)  PHENYLephrine 40 mcg/ml in normal saline Adult IV Push Syringe (For Blood Pressure Support) (100 mcg Intravenous Given 02/13/2020 1338)  sodium chloride 0.9 % bolus 1,000 mL (0 mLs Intravenous Stopped 02/05/2020 1338)  EPINEPHrine (ADRENALIN) 1 MG/10ML injection (1 mg Intravenous Given 02/25/2020 1301)  lactated ringers bolus 1,000 mL (1,000 mLs Intravenous New Bag/Given 02/16/2020 1334)    ED Course  I have reviewed the triage vital signs and the nursing notes.  Pertinent labs & imaging results that were available during my care of the patient were reviewed by me and considered in my medical decision making (see chart for details).    MDM Rules/Calculators/A&P                      Patient came in with worsening mental status.  Hypoxic on room air.  Intubated by myself for decreased mental status.  After intubation blood pressure dropped and had brief cardiac arrest.  Improved with  CPR and some epinephrine.  More fluids given.  Chest x-ray shows somewhat high position of ET tube and will be lowered.  Had push dose phenylephrine also.  Blood pressure is improved.  Given fluid bolus since does not appear to be in CHF at this time.  Given second liter of fluids.  Discussed with patient's wife.  They had not discussed CODE STATUS or what he would want done.  This point we are doing everything.  Has not had a fever.  White count only mildly elevated.  Temperature did go up to 100.4.  Not clear infection at this time however due to severity of illness we will give antibiotics to cover community-acquired pneumonia.  Fluid boluses have been given to cover greater than 30/kg but without fever or source of infection have not activated a code sepsis. Lactic acid is elevated but could be elevated secondary to hypotension or severe respiratory status.  Will discuss with hospitalist  CRITICAL CARE Performed by: Benjiman Core Total critical care time: 30 minutes Critical care time was exclusive of separately billable procedures and treating other patients. Critical care was necessary to treat or prevent imminent or life-threatening deterioration. Critical care was time spent personally by me on the following activities: development of treatment plan with patient and/or surrogate as well as nursing, discussions with consultants, evaluation of patient's response to treatment, examination of patient, obtaining history from patient or surrogate, ordering and performing treatments and interventions, ordering and review of laboratory studies, ordering and review of radiographic studies, pulse oximetry and re-evaluation of patient's condition.  Final Clinical Impression(s) / ED Diagnoses Final diagnoses:  Acute respiratory failure with hypoxia (HCC)  Hypotension, unspecified hypotension type    Rx / DC Orders ED Discharge Orders    None       Benjiman Core, MD 02/21/2020 1359

## 2020-01-31 DIAGNOSIS — R7881 Bacteremia: Secondary | ICD-10-CM

## 2020-01-31 DIAGNOSIS — A4150 Gram-negative sepsis, unspecified: Secondary | ICD-10-CM

## 2020-01-31 LAB — BLOOD CULTURE ID PANEL (REFLEXED)
Acinetobacter baumannii: NOT DETECTED
Candida albicans: NOT DETECTED
Candida glabrata: NOT DETECTED
Candida krusei: NOT DETECTED
Candida parapsilosis: NOT DETECTED
Candida tropicalis: NOT DETECTED
Enterobacter cloacae complex: NOT DETECTED
Enterobacteriaceae species: NOT DETECTED
Enterococcus species: NOT DETECTED
Escherichia coli: NOT DETECTED
Haemophilus influenzae: DETECTED — AB
Klebsiella oxytoca: NOT DETECTED
Klebsiella pneumoniae: NOT DETECTED
Listeria monocytogenes: NOT DETECTED
Neisseria meningitidis: NOT DETECTED
Proteus species: NOT DETECTED
Pseudomonas aeruginosa: NOT DETECTED
Serratia marcescens: NOT DETECTED
Staphylococcus aureus (BCID): NOT DETECTED
Staphylococcus species: NOT DETECTED
Streptococcus agalactiae: NOT DETECTED
Streptococcus pneumoniae: NOT DETECTED
Streptococcus pyogenes: NOT DETECTED
Streptococcus species: NOT DETECTED

## 2020-02-04 LAB — CULTURE, BLOOD (ROUTINE X 2)
Culture: NO GROWTH
Special Requests: ADEQUATE

## 2020-02-25 LAB — CULTURE, BLOOD (ROUTINE X 2): Special Requests: ADEQUATE

## 2020-02-26 NOTE — Progress Notes (Signed)
Made patient's family aware that patients oxygen levels were dropping and heart rate had decreased as well. Patient is unresponsive and appears comfortable. Will continue to monitor patient.

## 2020-02-26 NOTE — Death Summary Note (Signed)
DEATH SUMMARY   Patient Details  Name: Carl Barrera MRN: [id number] DOB: [birth year]  Admission/Discharge Information   Admit Date:  2020-02-15  Date of Death: Date of Death: 02/16/20  Time of Death: Time of Death: 0645  Length of Stay: 1  Referring Physician: Sharion Balloon, FNP   Reason(s) for Hospitalization  dyspnea  Diagnoses  Preliminary cause of death: COPD exacerbation, sepsis Secondary Diagnoses (including complications and co-morbidities):  Acute respiratory failure with hypoxia -Secondary to COPD exacerbation although cannot rule out underlying infectious process/pneumonia -Normally not on oxygen at home -Currently intubated on mechanical ventilation  COPD exacerbation -Initially planned for bronchodilators and Solu-Medrol -Patient's focus of care has been transitioned to focus on solely his comfort  Sepsis -presented with fever and elevated lactate -due to bacteremia -blood culture results pending at time of death  Bacteremia -gram negative coccobacillus -blood culture results pending at time of death  Cardiopulmonary arrest -Patient underwent CPR with epinephrine given -Occurred after the patient became hypotensive after intubation -Likely resultant from the patient's severe hypoxia and respiratory failure  Lactic acidosis -Do not feel this is completely attributable to sepsis -The patient's hypoxia has contributed to his elevated lactic acid -No longer active issue as the patient's focus of care has been transitioned to focus only on his comfort -Patient spouse does not want any further blood work -IV antibiotics and blood cultures were obtained prior to both of care discussion after which the patient's spouse decided to transition the patient's focus of care to full comfort  Coronary artery disease -Patient quit taking his Plavix and Imdur over 3 years ago -No longer active issue as the patient's focus of care has been transitioned to focus only  on his comfort  Hyperlipidemia -Patient quit taking his Plavix and Imdur over 3 years ago -No longer active issue as the patient's focus of care has been transitioned to focus only on his comfort  Tobacco abuse -Patient had continued to smoke  Goals of care discussion Advance care planning, including the explanation and discussion of advance directives was carried out with the patient and family.  Code status including explanations of "Full Code" and "DNR" and alternatives were discussed in detail.  Discussion of end-of-life issues including but not limited palliative care, hospice care and the concept of hospice, other end-of-life care options, power of attorney for health care decisions, living wills, and physician orders for life-sustaining treatment were also discussed with the patient and family.  Total face to face time 16 minutes. -After discussion with the patient's spouse, she felt that it was in the patient's best interest to transition his care to focus solely on his comfort  Carl Barrera (including significant findings, care, treatment, and services provided and events leading to death)  Carl Barrera is a 74 y.o. year old male  with medical history of COPD, hyperlipidemia, coronary artery disease presenting with shortness of breath for the last 3 days.  In actuality, the patient has been complaining shortness of breath for over a month with worsening over the last 3 days.  He continues to smoke 1 pack/day.  He has an over 50-pack-year history of tobacco.  The patient is unable to provide any history at this time secondary to extremis and being intubated.  Apparently, EMS was activated because of shortness of breath.  The patient was found to have oxygen saturation of 65% room air.  The patient was given Solu-Medrol by EMS.  Notably, the patient had not seen a  physician in 3 to 4 years.  Furthermore, the patient quit taking all of his prescription medications 3 to 4 years ago.   In the emergency department, the patient was initially placed on nonrebreather.  He became increasingly somnolent.  There was concern about the patient's ability to protect his airway.  As result, the patient was intubated in the emergency department.  After intubation, the patient became hypotensive.  Subsequently developed PEA.  ACLS protocol was instituted and CPR was started.  The patient was given epinephrine and also a phenylephrine push.  He had return of spontaneous circulation. EDP spoke with critical care who felt the patient was okay to stay at [facility]. As I came to evaluate the patient, the patient became increasingly hypotensive once again.  I ordered another liter of fluid bolus, but the patient continued to remain hypotensive. I had an extensive goals of care discussion and advance care planning discussion with the patient's spouse at the bedside.  I discussed the patient's critical condition and overall poor prognosis given his severe COPD and other comorbidities and advanced age. Chaplain Carl Barrera was present during the discussion.  Therapeutic listening was instituted.  After further discussion, the patient's wife felt there was in the patient's best interest to transition his care to focus solely on his comfort.  She did not want any further invasive procedures.  After initial fluid boluses in the ED, the remained hypotensive, but became more alert.  His blood pressure did show some improvement, but remained very soft.  The patient was transferred to ICU.  The patient expressed that he wanted to be extubated.  Further goals of care discussions were held with patient's spouse and son.  They did not want the patient to suffer and as such they did not want any further blood work, diagnostic studies or treatments with curative intent.  They agree with compassionate extubation which was in line with the patient's wishes and desires.  The patient was extubated and full comfort  measures were instituted.  He did not have any further distress.     Pertinent Labs and Studies  Significant Diagnostic Studies DG Chest Portable 1 View  Result Date: 02/02/2020 CLINICAL DATA:  Shortness of breath ETT placement EXAM: PORTABLE CHEST 1 VIEW COMPARISON:  12/07/2015 FINDINGS: Rotated examination. Severe, bullous emphysema. Endotracheal tube is positioned with tip near the thoracic inlet. The carina is difficult to directly visualize due to rotation, although endotracheal tube tip is approximately 9 cm above the carina. Esophagogastric tube with tip below the diaphragm, side port at the level of the gastroesophageal junction. The visualized skeletal structures are unremarkable. IMPRESSION: 1. Rotated examination with severe, bullous emphysema. No acute appearing airspace opacity. 2. Endotracheal tube is positioned with tip near the thoracic inlet. The carina is difficult to directly visualize due to rotation, although endotracheal tube tip is approximately 9 cm above the carina. 3. Esophagogastric tube with tip below the diaphragm, side port at the level of the gastroesophageal junction. Electronically Signed   By: Lauralyn Primes M.D.   On: 02/05/2020 13:26    Microbiology Recent Results (from the past 240 hour(s))  Culture, blood (routine x 2)     Status: None (Preliminary result)   Collection Time: 02/24/2020  1:14 PM   Specimen: BLOOD LEFT ARM  Result Value Ref Range Status   Specimen Description   Final    BLOOD LEFT ARM Performed at [facility], [address] [place], [place] [address]    Special  Requests   Final    BOTTLES DRAWN AEROBIC AND ANAEROBIC Blood Culture adequate volume Performed at [facility], [address], [place], [place] [address]    Culture  Setup Time   Final    GRAM NEGATIVE COCCOBACILLI ANAEROBIC BOTTLE ONLY CRITICAL RESULT CALLED TO, READ BACK BY AND VERIFIED WITH: PHARMD S HURTH Performed at [facility], [address], [place], [place]  [address]    Culture GRAM NEGATIVE COCCOBACILLI  Final   Report Status PENDING  Incomplete  Blood Culture ID Panel (Reflexed)     Status: Abnormal   Collection Time: 02/02/2020  1:14 PM  Result Value Ref Range Status   Enterococcus species NOT DETECTED NOT DETECTED Final   Listeria monocytogenes NOT DETECTED NOT DETECTED Final   Staphylococcus species NOT DETECTED NOT DETECTED Final   Staphylococcus aureus (BCID) NOT DETECTED NOT DETECTED Final   Streptococcus species NOT DETECTED NOT DETECTED Final   Streptococcus agalactiae NOT DETECTED NOT DETECTED Final   Streptococcus pneumoniae NOT DETECTED NOT DETECTED Final   Streptococcus pyogenes NOT DETECTED NOT DETECTED Final   Acinetobacter baumannii NOT DETECTED NOT DETECTED Final   Enterobacteriaceae species NOT DETECTED NOT DETECTED Final   Enterobacter cloacae complex NOT DETECTED NOT DETECTED Final   Escherichia coli NOT DETECTED NOT DETECTED Final   Klebsiella oxytoca NOT DETECTED NOT DETECTED Final   Klebsiella pneumoniae NOT DETECTED NOT DETECTED Final   Proteus species NOT DETECTED NOT DETECTED Final   Serratia marcescens NOT DETECTED NOT DETECTED Final   Haemophilus influenzae DETECTED (A) NOT DETECTED Final    Comment: PATIENT DISCHARGED OR EXPIRED   Neisseria meningitidis NOT DETECTED NOT DETECTED Final   Pseudomonas aeruginosa NOT DETECTED NOT DETECTED Final   Candida albicans NOT DETECTED NOT DETECTED Final   Candida glabrata NOT DETECTED NOT DETECTED Final   Candida krusei NOT DETECTED NOT DETECTED Final   Candida parapsilosis NOT DETECTED NOT DETECTED Final   Candida tropicalis NOT DETECTED NOT DETECTED Final    Comment: Performed at [facility], [address], [place], [place] [address]  Culture, blood (routine x 2)     Status: None (Preliminary result)   Collection Time: 01/26/2020  1:30 PM   Specimen: BLOOD RIGHT ARM  Result Value Ref Range Status   Specimen Description BLOOD RIGHT ARM  Final   Special Requests    Final    BOTTLES DRAWN AEROBIC AND ANAEROBIC Blood Culture adequate volume   Culture   Final    NO GROWTH < 24 HOURS Performed at [facility], [address] [place], [place] [address]    Report Status PENDING  Incomplete  Respiratory Panel by RT PCR (Flu A&B, Covid) - Nasopharyngeal Swab     Status: None   Collection Time: 02/01/2020  3:36 PM   Specimen: Nasopharyngeal Swab  Result Value Ref Range Status   SARS Coronavirus 2 by RT PCR NEGATIVE NEGATIVE Final    Comment: (NOTE) SARS-CoV-2 target nucleic acids are NOT DETECTED. The SARS-CoV-2 RNA is generally detectable in upper respiratoy specimens during the acute phase of infection. The lowest concentration of SARS-CoV-2 viral copies this assay can detect is 131 copies/mL. A negative result does not preclude SARS-Cov-2 infection and should not be used as the sole basis for treatment or other patient management decisions. A negative result may occur with  improper specimen collection/handling, submission of specimen other than nasopharyngeal swab, presence of viral mutation(s) within the areas targeted by this assay, and inadequate number of  viral copies (<131 copies/mL). A negative result must be combined with clinical observations, patient history, and epidemiological information. The expected result is Negative. Fact Sheet for Patients:  https://www.moore.com/ Fact Sheet for Healthcare Providers:  https://www.young.biz/ This test is not yet ap proved or cleared by the [place] FDA and  has been authorized for detection and/or diagnosis of SARS-CoV-2 by FDA under an Emergency Use Authorization (EUA). This EUA will remain  in effect (meaning this test can be used) for the duration of the COVID-19 declaration under Section 564(b)(1) of the Act, 21 U.S.C. section 360bbb-3(b)(1), unless the authorization is terminated or revoked sooner.    Influenza A by PCR NEGATIVE NEGATIVE Final    Influenza B by PCR NEGATIVE NEGATIVE Final    Comment: (NOTE) The Xpert Xpress SARS-CoV-2/FLU/RSV assay is intended as an aid in  the diagnosis of influenza from Nasopharyngeal swab specimens and  should not be used as a sole basis for treatment. Nasal washings and  aspirates are unacceptable for Xpert Xpress SARS-CoV-2/FLU/RSV  testing. Fact Sheet for Patients: https://www.moore.com/ Fact Sheet for Healthcare Providers: https://www.young.biz/ This test is not yet approved or cleared by the [place] FDA and  has been authorized for detection and/or diagnosis of SARS-CoV-2 by  FDA under an Emergency Use Authorization (EUA). This EUA will remain  in effect (meaning this test can be used) for the duration of the  Covid-19 declaration under Section 564(b)(1) of the Act, 21  U.S.C. section 360bbb-3(b)(1), unless the authorization is  terminated or revoked. Performed at [facility], [address] [place], [place] [address]   MRSA PCR Screening     Status: None   Collection Time: 01/29/2020  6:39 PM   Specimen: Nasal Mucosa; Nasopharyngeal  Result Value Ref Range Status   MRSA by PCR NEGATIVE NEGATIVE Final    Comment:        The GeneXpert MRSA Assay (FDA approved for NASAL specimens only), is one component of a comprehensive MRSA colonization surveillance program. It is not intended to diagnose MRSA infection nor to guide or monitor treatment for MRSA infections. Performed at [facility], [address], [place], [place] [address]     Lab Basic Metabolic Panel: Recent Labs  Lab 02/15/2020 1313 02/13/2020 1327  NA 141 140  K 3.9 3.9  CL 102 102  CO2 26  --   GLUCOSE 143* 139*  BUN 30* 35*  CREATININE 1.13 1.10  CALCIUM 8.1*  --    Liver Function Tests: Recent Labs  Lab 02/22/2020 1313  AST 46*  ALT 27  ALKPHOS 67  BILITOT 1.4*  PROT 6.1*  ALBUMIN 3.1*   No results for input(s): LIPASE, AMYLASE in the last 168 hours. No  results for input(s): AMMONIA in the last 168 hours. CBC: Recent Labs  Lab 31-Jan-2020 1313 02/20/2020 1327  WBC 12.2*  --   NEUTROABS 10.2*  --   HGB 16.5 18.0*  HCT 54.0* 53.0*  MCV 107.6*  --   PLT 204  --    Cardiac Enzymes: No results for input(s): CKTOTAL, CKMB, CKMBINDEX, TROPONINI in the last 168 hours. Sepsis Labs: Recent Labs  Lab 31-Jan-2020 1313  WBC 12.2*  LATICACIDVEN 6.4*    Procedures/Operations  intubation   Onalee Hua Juanell Saffo 02/01/2020, 6:34 PM

## 2020-02-26 NOTE — Progress Notes (Addendum)
Family has been at bedside, chaplain called and came to speak with family per family request. Emotional support provided. IV's and foley catheter removed without complications after family left. Post mortem care provided. Patient transferred off the floor for funeral home pick up. Patient's wife and son took ALL of patient belongings home including dentures.

## 2020-02-26 NOTE — Progress Notes (Signed)
Pt went asystole at 0645 with his wife and son at the bedside. Post mortem flow sheet has been completed, [facility] has been notified. Per [facility], pt is suitable for tissue donation only; body is to be transferred to the morgue once the family leaves.

## 2020-02-26 DEATH — deceased

## 2020-10-10 IMAGING — DX DG CHEST 1V PORT
1 series · 1 of 1 positions shown · non-contrast
Comparison: 12/07/2015

CLINICAL DATA: Shortness of breath ETT placement

EXAM:
PORTABLE CHEST 1 VIEW

[chest ap]
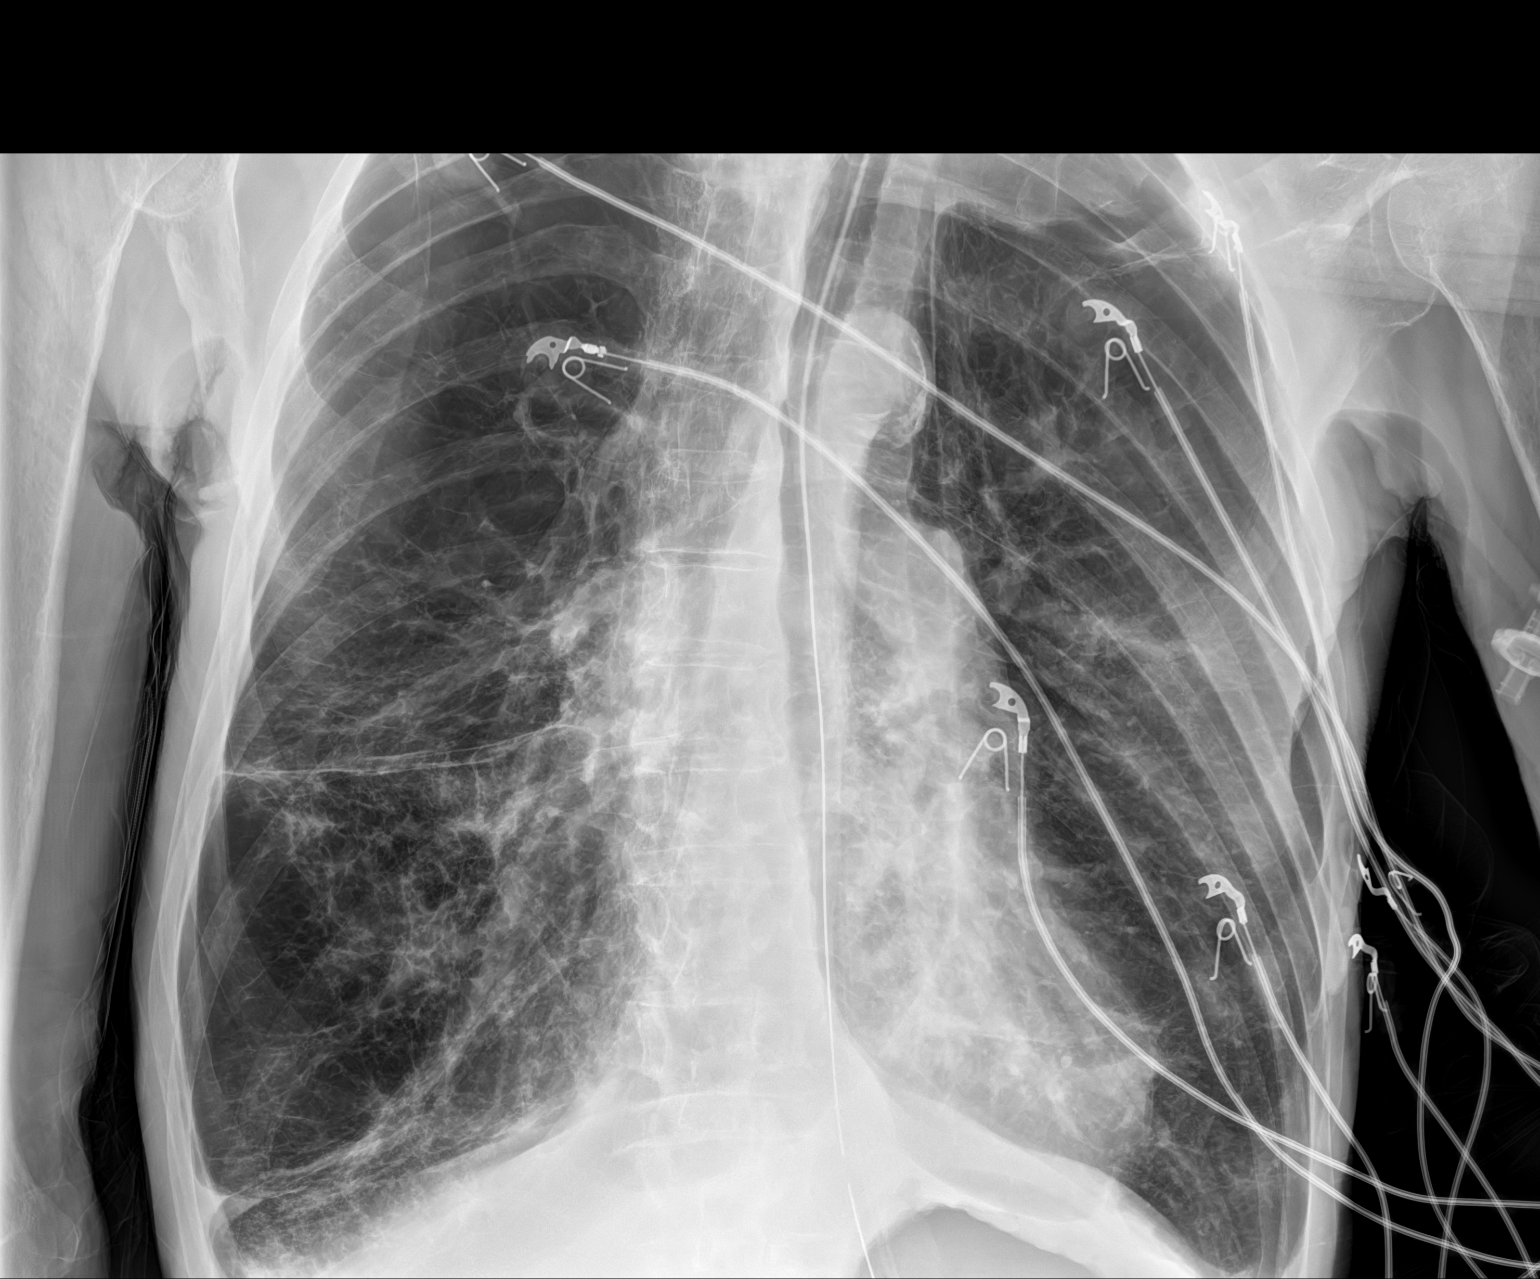

[1 of 1 positions shown; findings below may reference images not displayed]

FINDINGS: Rotated examination. Severe, bullous emphysema. Endotracheal tube is
positioned with tip near the thoracic inlet. The carina is difficult
to directly visualize due to rotation, although endotracheal tube
tip is approximately 9 cm above the carina. Esophagogastric tube
with tip below the diaphragm, side port at the level of the
gastroesophageal junction. The visualized skeletal structures are
unremarkable.
IMPRESSION: 1. Rotated examination with severe, bullous emphysema. No acute
appearing airspace opacity.

2. Endotracheal tube is positioned with tip near the thoracic inlet.
The carina is difficult to directly visualize due to rotation,
although endotracheal tube tip is approximately 9 cm above the
carina.

3. Esophagogastric tube with tip below the diaphragm, side port at
the level of the gastroesophageal junction.
